# Patient Record
Sex: Male | Born: 1950
Health system: Southern US, Community
[De-identification: ages and names within clinical notes are randomized; demographics above are authoritative.]

---

## 1998-04-19 HISTORY — PX: LUMBAR DISC SURGERY: SHX700

## 2000-01-08 ENCOUNTER — Ambulatory Visit (HOSPITAL_COMMUNITY): Admission: RE | Admit: 2000-01-08 | Discharge: 2000-01-08 | Payer: Self-pay | Admitting: Neurosurgery

## 2000-01-08 ENCOUNTER — Encounter: Payer: Self-pay | Admitting: Neurosurgery

## 2000-02-16 ENCOUNTER — Ambulatory Visit (HOSPITAL_COMMUNITY): Admission: RE | Admit: 2000-02-16 | Discharge: 2000-02-17 | Payer: Self-pay | Admitting: Neurosurgery

## 2000-07-19 ENCOUNTER — Encounter: Admission: RE | Admit: 2000-07-19 | Discharge: 2000-07-19 | Payer: Self-pay | Admitting: Neurosurgery

## 2000-07-19 ENCOUNTER — Encounter: Payer: Self-pay | Admitting: Neurosurgery

## 2000-08-04 ENCOUNTER — Encounter: Payer: Self-pay | Admitting: Neurosurgery

## 2000-08-04 ENCOUNTER — Encounter: Admission: RE | Admit: 2000-08-04 | Discharge: 2000-08-04 | Payer: Self-pay | Admitting: Neurosurgery

## 2000-08-18 ENCOUNTER — Encounter: Admission: RE | Admit: 2000-08-18 | Discharge: 2000-08-18 | Payer: Self-pay | Admitting: Neurosurgery

## 2000-08-18 ENCOUNTER — Encounter: Payer: Self-pay | Admitting: Neurosurgery

## 2017-06-09 DIAGNOSIS — Z125 Encounter for screening for malignant neoplasm of prostate: Secondary | ICD-10-CM | POA: Diagnosis not present

## 2017-06-09 DIAGNOSIS — R1310 Dysphagia, unspecified: Secondary | ICD-10-CM | POA: Diagnosis not present

## 2017-06-09 DIAGNOSIS — E785 Hyperlipidemia, unspecified: Secondary | ICD-10-CM | POA: Diagnosis not present

## 2017-06-09 DIAGNOSIS — Z2820 Immunization not carried out because of patient decision for unspecified reason: Secondary | ICD-10-CM | POA: Diagnosis not present

## 2017-06-09 DIAGNOSIS — Z0000 Encounter for general adult medical examination without abnormal findings: Secondary | ICD-10-CM | POA: Diagnosis not present

## 2017-06-09 DIAGNOSIS — Z1331 Encounter for screening for depression: Secondary | ICD-10-CM | POA: Diagnosis not present

## 2017-06-09 DIAGNOSIS — Z9181 History of falling: Secondary | ICD-10-CM | POA: Diagnosis not present

## 2017-06-09 DIAGNOSIS — Z1159 Encounter for screening for other viral diseases: Secondary | ICD-10-CM | POA: Diagnosis not present

## 2017-06-09 DIAGNOSIS — Z6824 Body mass index (BMI) 24.0-24.9, adult: Secondary | ICD-10-CM | POA: Diagnosis not present

## 2018-03-29 DIAGNOSIS — L820 Inflamed seborrheic keratosis: Secondary | ICD-10-CM | POA: Diagnosis not present

## 2018-04-19 HISTORY — PX: COLONOSCOPY WITH ESOPHAGOGASTRODUODENOSCOPY (EGD): SHX5779

## 2018-06-14 DIAGNOSIS — Z0000 Encounter for general adult medical examination without abnormal findings: Secondary | ICD-10-CM | POA: Diagnosis not present

## 2018-06-14 DIAGNOSIS — Z2820 Immunization not carried out because of patient decision for unspecified reason: Secondary | ICD-10-CM | POA: Diagnosis not present

## 2018-06-14 DIAGNOSIS — Z1331 Encounter for screening for depression: Secondary | ICD-10-CM | POA: Diagnosis not present

## 2018-06-14 DIAGNOSIS — M12811 Other specific arthropathies, not elsewhere classified, right shoulder: Secondary | ICD-10-CM | POA: Diagnosis not present

## 2018-06-14 DIAGNOSIS — E785 Hyperlipidemia, unspecified: Secondary | ICD-10-CM | POA: Diagnosis not present

## 2018-06-14 DIAGNOSIS — N529 Male erectile dysfunction, unspecified: Secondary | ICD-10-CM | POA: Diagnosis not present

## 2018-06-14 DIAGNOSIS — Z125 Encounter for screening for malignant neoplasm of prostate: Secondary | ICD-10-CM | POA: Diagnosis not present

## 2018-06-14 DIAGNOSIS — Z6824 Body mass index (BMI) 24.0-24.9, adult: Secondary | ICD-10-CM | POA: Diagnosis not present

## 2018-06-14 DIAGNOSIS — Z79899 Other long term (current) drug therapy: Secondary | ICD-10-CM | POA: Diagnosis not present

## 2018-06-14 DIAGNOSIS — R1310 Dysphagia, unspecified: Secondary | ICD-10-CM | POA: Diagnosis not present

## 2018-06-14 DIAGNOSIS — R739 Hyperglycemia, unspecified: Secondary | ICD-10-CM | POA: Diagnosis not present

## 2018-06-14 DIAGNOSIS — Z9181 History of falling: Secondary | ICD-10-CM | POA: Diagnosis not present

## 2018-08-17 ENCOUNTER — Other Ambulatory Visit: Payer: Self-pay | Admitting: Gastroenterology

## 2018-08-21 ENCOUNTER — Other Ambulatory Visit: Payer: Self-pay | Admitting: Gastroenterology

## 2018-08-21 NOTE — Telephone Encounter (Signed)
LMOM to notify patient he needed to be seen by Dr. Lyndel Safe before we could send him a new Rx.

## 2018-08-21 NOTE — Telephone Encounter (Signed)
Patient wife called said her husband needs a med refill for Pantoprazole 40mg . He is a patient of his from [place]

## 2018-08-29 DIAGNOSIS — R1310 Dysphagia, unspecified: Secondary | ICD-10-CM | POA: Diagnosis not present

## 2018-08-29 DIAGNOSIS — M25521 Pain in right elbow: Secondary | ICD-10-CM | POA: Diagnosis not present

## 2018-08-29 DIAGNOSIS — Z6824 Body mass index (BMI) 24.0-24.9, adult: Secondary | ICD-10-CM | POA: Diagnosis not present

## 2018-08-29 DIAGNOSIS — M25511 Pain in right shoulder: Secondary | ICD-10-CM | POA: Diagnosis not present

## 2018-09-12 DIAGNOSIS — M6281 Muscle weakness (generalized): Secondary | ICD-10-CM | POA: Diagnosis not present

## 2018-09-12 DIAGNOSIS — M25611 Stiffness of right shoulder, not elsewhere classified: Secondary | ICD-10-CM | POA: Diagnosis not present

## 2018-09-12 DIAGNOSIS — M25511 Pain in right shoulder: Secondary | ICD-10-CM | POA: Diagnosis not present

## 2018-09-19 ENCOUNTER — Encounter: Payer: Self-pay | Admitting: Gastroenterology

## 2018-09-20 DIAGNOSIS — M6281 Muscle weakness (generalized): Secondary | ICD-10-CM | POA: Diagnosis not present

## 2018-09-20 DIAGNOSIS — M25611 Stiffness of right shoulder, not elsewhere classified: Secondary | ICD-10-CM | POA: Diagnosis not present

## 2018-09-20 DIAGNOSIS — M25511 Pain in right shoulder: Secondary | ICD-10-CM | POA: Diagnosis not present

## 2018-09-25 DIAGNOSIS — K222 Esophageal obstruction: Secondary | ICD-10-CM | POA: Diagnosis not present

## 2018-09-25 DIAGNOSIS — Z800 Family history of malignant neoplasm of digestive organs: Secondary | ICD-10-CM | POA: Diagnosis not present

## 2018-09-25 DIAGNOSIS — K219 Gastro-esophageal reflux disease without esophagitis: Secondary | ICD-10-CM | POA: Diagnosis not present

## 2018-09-25 DIAGNOSIS — R1310 Dysphagia, unspecified: Secondary | ICD-10-CM | POA: Diagnosis not present

## 2018-09-28 ENCOUNTER — Other Ambulatory Visit: Payer: Self-pay | Admitting: Internal Medicine

## 2018-09-28 DIAGNOSIS — R1310 Dysphagia, unspecified: Secondary | ICD-10-CM

## 2018-10-06 DIAGNOSIS — Z1159 Encounter for screening for other viral diseases: Secondary | ICD-10-CM | POA: Diagnosis not present

## 2018-10-12 DIAGNOSIS — Z800 Family history of malignant neoplasm of digestive organs: Secondary | ICD-10-CM | POA: Diagnosis not present

## 2018-10-12 DIAGNOSIS — K449 Diaphragmatic hernia without obstruction or gangrene: Secondary | ICD-10-CM | POA: Diagnosis not present

## 2018-10-12 DIAGNOSIS — R1310 Dysphagia, unspecified: Secondary | ICD-10-CM | POA: Diagnosis not present

## 2018-10-12 DIAGNOSIS — D125 Benign neoplasm of sigmoid colon: Secondary | ICD-10-CM | POA: Diagnosis not present

## 2018-10-12 DIAGNOSIS — K200 Eosinophilic esophagitis: Secondary | ICD-10-CM | POA: Diagnosis not present

## 2018-10-12 DIAGNOSIS — K5730 Diverticulosis of large intestine without perforation or abscess without bleeding: Secondary | ICD-10-CM | POA: Diagnosis not present

## 2018-10-12 DIAGNOSIS — K635 Polyp of colon: Secondary | ICD-10-CM | POA: Diagnosis not present

## 2018-10-12 DIAGNOSIS — K621 Rectal polyp: Secondary | ICD-10-CM | POA: Diagnosis not present

## 2018-10-12 DIAGNOSIS — K229 Disease of esophagus, unspecified: Secondary | ICD-10-CM | POA: Diagnosis not present

## 2018-10-12 DIAGNOSIS — K222 Esophageal obstruction: Secondary | ICD-10-CM | POA: Diagnosis not present

## 2018-10-12 DIAGNOSIS — K228 Other specified diseases of esophagus: Secondary | ICD-10-CM | POA: Diagnosis not present

## 2018-10-12 DIAGNOSIS — Z1211 Encounter for screening for malignant neoplasm of colon: Secondary | ICD-10-CM | POA: Diagnosis not present

## 2018-10-31 DIAGNOSIS — M25611 Stiffness of right shoulder, not elsewhere classified: Secondary | ICD-10-CM | POA: Diagnosis not present

## 2018-10-31 DIAGNOSIS — M6281 Muscle weakness (generalized): Secondary | ICD-10-CM | POA: Diagnosis not present

## 2018-10-31 DIAGNOSIS — M25511 Pain in right shoulder: Secondary | ICD-10-CM | POA: Diagnosis not present

## 2018-11-02 ENCOUNTER — Ambulatory Visit
Admission: RE | Admit: 2018-11-02 | Discharge: 2018-11-02 | Disposition: A | Discharge status: Home / Self Care | Payer: PPO | Source: Ambulatory Visit | Attending: Internal Medicine | Admitting: Internal Medicine

## 2018-11-02 DIAGNOSIS — R1310 Dysphagia, unspecified: Secondary | ICD-10-CM

## 2018-11-02 DIAGNOSIS — K449 Diaphragmatic hernia without obstruction or gangrene: Secondary | ICD-10-CM | POA: Diagnosis not present

## 2018-11-02 DIAGNOSIS — K219 Gastro-esophageal reflux disease without esophagitis: Secondary | ICD-10-CM | POA: Diagnosis not present

## 2018-11-08 ENCOUNTER — Ambulatory Visit (INDEPENDENT_AMBULATORY_CARE_PROVIDER_SITE_OTHER): Payer: PPO | Admitting: Allergy and Immunology

## 2018-11-08 ENCOUNTER — Other Ambulatory Visit: Payer: Self-pay

## 2018-11-08 ENCOUNTER — Encounter: Payer: Self-pay | Admitting: Allergy and Immunology

## 2018-11-08 VITALS — BP 118/74 | HR 64 | Temp 98.0°F | Resp 16 | Ht 69.0 in | Wt 162.0 lb

## 2018-11-08 DIAGNOSIS — Z91018 Allergy to other foods: Secondary | ICD-10-CM | POA: Diagnosis not present

## 2018-11-08 DIAGNOSIS — K200 Eosinophilic esophagitis: Secondary | ICD-10-CM | POA: Diagnosis not present

## 2018-11-08 DIAGNOSIS — R490 Dysphonia: Secondary | ICD-10-CM | POA: Diagnosis not present

## 2018-11-08 MED ORDER — BUDESONIDE 0.5 MG/2ML IN SUSP: RESPIRATORY_TRACT | 5 refills | Status: DC

## 2018-11-08 NOTE — Progress Notes (Signed)
[place] [facility] [place] - [place] - [place] - [place]   Dear Dr. Earlean Shawl,  Thank you for referring Thomas Mccoy to the [facility] on 11/08/2018.   Below is a summation of this patient's evaluation and recommendations.  Thank you for your referral. I will keep you informed about this patient's response to treatment.   If you have any questions please do not hesitate to contact me.   Sincerely,  Jiles Prows, MD Allergy / Immunology [place] [facility]   ______________________________________________________________________    NEW PATIENT NOTE  Referring Provider: Richmond Campbell, MD Primary Provider: Street, Sharon Mt, MD Date of office visit: 11/08/2018    Subjective:   Chief Complaint:  Thomas Mccoy (DOB: 03-16-1951) is a 68 y.o. male who presents to the clinic on 11/08/2018 with a chief complaint of Swallowing Issue .     HPI: Thomas Mccoy presents to this clinic in evaluation of eosinophilic esophagitis.  Since Thomas Mccoy was a late teenager he had noticed that he had problems swallowing and this has been a progressive issue to the point where he now has recurrent episodes of choking and esophageal obstruction requiring emesis for clearing of his esophagus.  He has been under the care of a GI doctor for 15 years and being treated with multiple esophageal dilations.  His last dilation performed by Dr. Earlean Shawl was 4 weeks ago and he certainly does get improvement regarding this issue though he always redevelops his choking and obstruction the further he gets away from the dilation.   With his recent dilation he was also counseled to start swallowed Flovent.  This is the first time that he has started a swallowed steroid.  Unfortunately, he is getting hoarseness from the use of this swallowed steroid.  He has been on pantoprazole for 10 years.  He does not consume any caffeine but does drink wine every  night.  He has not practiced any food avoidance measures.  He has no other associated atopic disease symptoms.  History reviewed. No pertinent past medical history.  Past Surgical History:  Procedure Laterality Date  . COLONOSCOPY WITH ESOPHAGOGASTRODUODENOSCOPY (EGD)  2020  . LUMBAR DISC SURGERY  2000    Allergies as of 11/08/2018   No Known Allergies     Medication List      CALCIUM 1200 PO Take 1 tablet by mouth daily.   fluticasone 220 MCG/ACT inhaler Commonly known as: FLOVENT HFA Two times daily, spray 2 puffs into mouth and swallow, DO NOT INHALE.  Do not use spacer.  No food or drink 30 minutes before or after sprays.   pantoprazole 40 MG tablet Commonly known as: PROTONIX Take 1 tablet by mouth daily.       Review of systems negative except as noted in HPI / PMHx or noted below:  Review of Systems  Constitutional: Negative.   HENT: Negative.   Eyes: Negative.   Respiratory: Negative.   Cardiovascular: Negative.   Gastrointestinal: Negative.   Genitourinary: Negative.   Musculoskeletal: Negative.   Skin: Negative.   Neurological: Negative.   Endo/Heme/Allergies: Negative.   Psychiatric/Behavioral: Negative.     Family History  Problem Relation Age of Onset  . Colon cancer Brother   . Bone cancer Brother   . Stroke Brother     Social History   Socioeconomic History  . Marital status: Married    Spouse name: Not on file  . Number  of children: Not on file  . Years of education: Not on file  . Highest education level: Not on file  Occupational History  . Not on file  Social Needs  . Financial resource strain: Not on file  . Food insecurity    Worry: Not on file    Inability: Not on file  . Transportation needs    Medical: Not on file    Non-medical: Not on file  Tobacco Use  . Smoking status: Never Smoker  . Smokeless tobacco: Never Used  Substance and Sexual Activity  . Alcohol use: Yes    Alcohol/week: 7.0 standard drinks    Types: 7  Glasses of wine per week  . Drug use: Never  . Sexual activity: Not on file  Lifestyle  . Physical activity    Days per week: Not on file    Minutes per session: Not on file  . Stress: Not on file  Relationships  . Social Herbalist on phone: Not on file    Gets together: Not on file    Attends religious service: Not on file    Active member of club or organization: Not on file    Attends meetings of clubs or organizations: Not on file    Relationship status: Not on file  . Intimate partner violence    Fear of current or ex partner: Not on file    Emotionally abused: Not on file    Physically abused: Not on file    Forced sexual activity: Not on file  Other Topics Concern  . Not on file  Social History Narrative  . Not on file    Environmental and Social history  Lives in a house with a dry environment, a dog located inside the household, hardwood in the bedroom, no plastic on the bed, no plastic on the pillow, no smoking located inside the household.  Objective:   Vitals:   11/08/18 0933  BP: 118/74  Pulse: 64  Resp: 16  Temp: 98 F (36.7 C)  SpO2: 97%   Height: 5\' 9"  (175.3 cm) Weight: 162 lb (73.5 kg)  Physical Exam Constitutional:      Appearance: He is not diaphoretic.  HENT:     Head: Normocephalic. No right periorbital erythema or left periorbital erythema.     Right Ear: Tympanic membrane, ear canal and external ear normal.     Left Ear: Tympanic membrane, ear canal and external ear normal.     Nose: Nose normal. No mucosal edema or rhinorrhea.     Mouth/Throat:     Pharynx: No oropharyngeal exudate.  Eyes:     General: Lids are normal.     Conjunctiva/sclera: Conjunctivae normal.     Pupils: Pupils are equal, round, and reactive to light.  Neck:     Thyroid: No thyromegaly.     Trachea: Trachea normal. No tracheal deviation.  Cardiovascular:     Rate and Rhythm: Normal rate and regular rhythm.     Heart sounds: Normal heart sounds, S1  normal and S2 normal. No murmur.  Pulmonary:     Effort: Pulmonary effort is normal. No respiratory distress.     Breath sounds: No stridor. No wheezing or rales.  Chest:     Chest wall: No tenderness.  Abdominal:     General: There is no distension.     Palpations: Abdomen is soft. There is no mass.     Tenderness: There is no abdominal tenderness. There is  no guarding or rebound.  Musculoskeletal:        General: No tenderness.  Lymphadenopathy:     Head:     Right side of head: No tonsillar adenopathy.     Left side of head: No tonsillar adenopathy.     Cervical: No cervical adenopathy.  Skin:    Coloration: Skin is not pale.     Findings: No erythema or rash.     Nails: There is no clubbing.   Neurological:     Mental Status: He is alert.     Diagnostics: Allergy skin tests were performed.  He did not demonstrate any hypersensitivity against a screening panel of foods.  Results of a esophageal biopsy performed 13 October 2018 identified the following:  B. DISTAL ESOPHAGUS, BIOPSY: Reactive squamous epithelium with increased numbers of intraepithelial eosinophils (up to 40 per hpf). Negative for intestinal metaplasia, dysplasia, and malignancy.  C. MID ESOPHAGUS, BIOPSY: Reactive squamous epithelium with increased numbers of intraepithelial eosinophils (up to 24 per hpf). Negative for intestinal metaplasia, dysplasia, and malignancy.  D. PROXIMAL ESOPHAGUS, BIOPSY: Reactive squamous epithelium with increased numbers of intraepithelial eosinophils (up to 17 per hpf). Negative for intestinal metaplasia, dysplasia, and malignancy.  Assessment and Plan:    1. Eosinophilic esophagitis   2. Food allergy   3. Hoarseness     1.  Allergen avoidance measures?  2.  Dairy avoidance diet?  3.  FFED diet?  4.  Exchange Flovent for budesonide 0.5 mg ampule mixed in 6-10 pack sucralose swallowed twice a day  5.  Does exchange of Flovent for  budesonide eliminate hoarseness?  6.  Continue budesonide for a full 12 weeks and twice a day and then if doing well attempted taper down to 1 time per day  7.  Return to clinic in 12 weeks or earlier if problem  8.  Obtain fall flu vaccine (and COVID vaccine)  Thomas Mccoy obviously has very significant symptomatic inflammation of his esophagus.  There did not appear to be a specific food allergy giving rise to this issue as assessed by skin testing.  He has the option of undergoing a dairy avoidance diet which appears to help 50% of people with eosinophilic esophagitis or a four food elimination diet which may help up to 75% of people with eosinophilic esophagitis.  He is presently considering his options regarding dietary manipulation.  Because he is developing hoarseness from Flovent we will switch him over to budesonide ampules.  I have asked him to contact me should he continue with hoarseness in the face of this medication manipulation as he will obviously require further evaluation and treatment if that is the case.  If he does choose a elimination diet as noted above then he can probably be rapidly tapered off his oral swallowed steroid and see what happens with the dietary manipulation as his sole mode of treatment for eosinophilic esophagitis.  If he does not use a elimination diet then he will probably require some dose of swallowed steroid for a prolonged period in time.  Jiles Prows, MD Allergy / Immunology [facility]

## 2018-11-08 NOTE — Patient Instructions (Signed)
  1.  Allergen avoidance measures?  2.  Dairy avoidance diet?  3.  FFED diet?  4.  Exchange Flovent for budesonide 0.5 mg ampule mixed in 6-10 pack sucralose swallowed twice a day  5.  Does exchange of Flovent for budesonide eliminate hoarseness?  6.  Continue budesonide for a full 12 weeks and twice a day and then if doing well attempted taper down to 1 time per day  7.  Return to clinic in 12 weeks or earlier if problem  8.  Obtain fall flu vaccine (and COVID vaccine)

## 2018-11-09 ENCOUNTER — Encounter: Payer: Self-pay | Admitting: Allergy and Immunology

## 2019-01-02 DIAGNOSIS — R142 Eructation: Secondary | ICD-10-CM | POA: Diagnosis not present

## 2019-01-02 DIAGNOSIS — K200 Eosinophilic esophagitis: Secondary | ICD-10-CM | POA: Diagnosis not present

## 2019-01-04 DIAGNOSIS — R972 Elevated prostate specific antigen [PSA]: Secondary | ICD-10-CM | POA: Diagnosis not present

## 2019-01-04 DIAGNOSIS — Z6822 Body mass index (BMI) 22.0-22.9, adult: Secondary | ICD-10-CM | POA: Diagnosis not present

## 2019-01-04 DIAGNOSIS — N401 Enlarged prostate with lower urinary tract symptoms: Secondary | ICD-10-CM | POA: Diagnosis not present

## 2019-01-04 DIAGNOSIS — M791 Myalgia, unspecified site: Secondary | ICD-10-CM | POA: Diagnosis not present

## 2019-01-04 DIAGNOSIS — N138 Other obstructive and reflux uropathy: Secondary | ICD-10-CM | POA: Diagnosis not present

## 2019-01-04 DIAGNOSIS — J989 Respiratory disorder, unspecified: Secondary | ICD-10-CM | POA: Diagnosis not present

## 2019-01-04 DIAGNOSIS — N390 Urinary tract infection, site not specified: Secondary | ICD-10-CM | POA: Diagnosis not present

## 2019-01-04 DIAGNOSIS — Z79899 Other long term (current) drug therapy: Secondary | ICD-10-CM | POA: Diagnosis not present

## 2019-01-05 ENCOUNTER — Inpatient Hospital Stay (HOSPITAL_COMMUNITY)
Admission: EM | Admit: 2019-01-05 | Discharge: 2019-01-07 | DRG: 443 | Disposition: A | Discharge status: Home / Self Care | Payer: PPO | Attending: Internal Medicine | Admitting: Internal Medicine

## 2019-01-05 ENCOUNTER — Emergency Department (HOSPITAL_COMMUNITY): Payer: PPO

## 2019-01-05 ENCOUNTER — Encounter (HOSPITAL_COMMUNITY): Payer: Self-pay | Admitting: Emergency Medicine

## 2019-01-05 ENCOUNTER — Other Ambulatory Visit: Payer: Self-pay

## 2019-01-05 ENCOUNTER — Inpatient Hospital Stay (HOSPITAL_COMMUNITY): Payer: PPO

## 2019-01-05 DIAGNOSIS — Z800 Family history of malignant neoplasm of digestive organs: Secondary | ICD-10-CM

## 2019-01-05 DIAGNOSIS — K200 Eosinophilic esophagitis: Secondary | ICD-10-CM | POA: Diagnosis not present

## 2019-01-05 DIAGNOSIS — B179 Acute viral hepatitis, unspecified: Secondary | ICD-10-CM | POA: Diagnosis not present

## 2019-01-05 DIAGNOSIS — Z20828 Contact with and (suspected) exposure to other viral communicable diseases: Secondary | ICD-10-CM | POA: Diagnosis not present

## 2019-01-05 DIAGNOSIS — B159 Hepatitis A without hepatic coma: Secondary | ICD-10-CM | POA: Diagnosis not present

## 2019-01-05 DIAGNOSIS — F1010 Alcohol abuse, uncomplicated: Secondary | ICD-10-CM | POA: Diagnosis present

## 2019-01-05 DIAGNOSIS — Z79899 Other long term (current) drug therapy: Secondary | ICD-10-CM

## 2019-01-05 DIAGNOSIS — Z66 Do not resuscitate: Secondary | ICD-10-CM

## 2019-01-05 DIAGNOSIS — K7689 Other specified diseases of liver: Secondary | ICD-10-CM | POA: Diagnosis not present

## 2019-01-05 DIAGNOSIS — Z7951 Long term (current) use of inhaled steroids: Secondary | ICD-10-CM

## 2019-01-05 DIAGNOSIS — K820 Obstruction of gallbladder: Secondary | ICD-10-CM | POA: Diagnosis not present

## 2019-01-05 DIAGNOSIS — N200 Calculus of kidney: Secondary | ICD-10-CM | POA: Diagnosis not present

## 2019-01-05 DIAGNOSIS — K759 Inflammatory liver disease, unspecified: Secondary | ICD-10-CM | POA: Insufficient documentation

## 2019-01-05 DIAGNOSIS — R7989 Other specified abnormal findings of blood chemistry: Secondary | ICD-10-CM | POA: Diagnosis not present

## 2019-01-05 LAB — COMPREHENSIVE METABOLIC PANEL
ALT: 3623 U/L — ABNORMAL HIGH (ref 0–44)
AST: 3527 U/L — ABNORMAL HIGH (ref 15–41)
Albumin: 2.7 g/dL — ABNORMAL LOW (ref 3.5–5.0)
Alkaline Phosphatase: 196 U/L — ABNORMAL HIGH (ref 38–126)
Anion gap: 8 (ref 5–15)
BUN: 10 mg/dL (ref 8–23)
CO2: 27 mmol/L (ref 22–32)
Calcium: 8.1 mg/dL — ABNORMAL LOW (ref 8.9–10.3)
Chloride: 101 mmol/L (ref 98–111)
Creatinine, Ser: 0.83 mg/dL (ref 0.61–1.24)
GFR calc Af Amer: >60 mL/min (ref >60)
GFR calc non Af Amer: >60 mL/min (ref >60)
Glucose, Bld: 101 mg/dL — ABNORMAL HIGH (ref 70–99)
Potassium: 4.2 mmol/L (ref 3.5–5.1)
Sodium: 136 mmol/L (ref 135–145)
Total Bilirubin: 10.4 mg/dL — ABNORMAL HIGH (ref 0.3–1.2)
Total Protein: 6 g/dL — ABNORMAL LOW (ref 6.5–8.1)

## 2019-01-05 LAB — HEPATIC FUNCTION PANEL
ALT: 3100 U/L — ABNORMAL HIGH (ref 0–44)
AST: 2780 U/L — ABNORMAL HIGH (ref 15–41)
Albumin: 2.4 g/dL — ABNORMAL LOW (ref 3.5–5.0)
Alkaline Phosphatase: 172 U/L — ABNORMAL HIGH (ref 38–126)
Bilirubin, Direct: 5.4 mg/dL — ABNORMAL HIGH (ref 0.0–0.2)
Indirect Bilirubin: 3.9 mg/dL — ABNORMAL HIGH (ref 0.3–0.9)
Total Bilirubin: 9.3 mg/dL — ABNORMAL HIGH (ref 0.3–1.2)
Total Protein: 5.4 g/dL — ABNORMAL LOW (ref 6.5–8.1)

## 2019-01-05 LAB — CBC WITH DIFFERENTIAL/PLATELET
Abs Immature Granulocytes: 0.01 10*3/uL (ref 0.00–0.07)
Basophils Absolute: 0 10*3/uL (ref 0.0–0.1)
Basophils Relative: 1 %
Eosinophils Absolute: 0 10*3/uL (ref 0.0–0.5)
Eosinophils Relative: 1 %
HCT: 48.4 % (ref 39.0–52.0)
Hemoglobin: 16.3 g/dL (ref 13.0–17.0)
Immature Granulocytes: 0 %
Lymphocytes Relative: 36 %
Lymphs Abs: 1.5 10*3/uL (ref 0.7–4.0)
MCH: 32.7 pg (ref 26.0–34.0)
MCHC: 33.7 g/dL (ref 30.0–36.0)
MCV: 97 fL (ref 80.0–100.0)
Monocytes Absolute: 0.8 10*3/uL (ref 0.1–1.0)
Monocytes Relative: 18 %
Neutro Abs: 1.9 10*3/uL (ref 1.7–7.7)
Neutrophils Relative %: 44 %
Platelets: 195 10*3/uL (ref 150–400)
RBC: 4.99 MIL/uL (ref 4.22–5.81)
RDW: 13.6 % (ref 11.5–15.5)
WBC: 4.2 10*3/uL (ref 4.0–10.5)
nRBC: 0 % (ref 0.0–0.2)

## 2019-01-05 LAB — URINALYSIS, ROUTINE W REFLEX MICROSCOPIC
Bacteria, UA: NONE SEEN
Glucose, UA: NEGATIVE mg/dL
Ketones, ur: 5 mg/dL — AB
Leukocytes,Ua: NEGATIVE
Nitrite: NEGATIVE
Protein, ur: NEGATIVE mg/dL
Specific Gravity, Urine: 1.016 (ref 1.005–1.030)
pH: 6 (ref 5.0–8.0)

## 2019-01-05 LAB — APTT: aPTT: 33 seconds (ref 24–36)

## 2019-01-05 LAB — GAMMA GT: GGT: 116 U/L — ABNORMAL HIGH (ref 7–50)

## 2019-01-05 LAB — PROTIME-INR
INR: 1.2 (ref 0.8–1.2)
Prothrombin Time: 15.2 seconds (ref 11.4–15.2)

## 2019-01-05 LAB — ACETAMINOPHEN LEVEL: Acetaminophen (Tylenol), Serum: <10 ug/mL — ABNORMAL LOW (ref 10–30)

## 2019-01-05 LAB — LACTATE DEHYDROGENASE: LDH: 508 U/L — ABNORMAL HIGH (ref 98–192)

## 2019-01-05 LAB — LIPASE, BLOOD: Lipase: 57 U/L — ABNORMAL HIGH (ref 11–51)

## 2019-01-05 LAB — SARS CORONAVIRUS 2 (TAT 6-24 HRS): SARS Coronavirus 2: NEGATIVE

## 2019-01-05 MED ORDER — ONDANSETRON HCL 4 MG/2ML IJ SOLN
4.0000 mg | Freq: Once | INTRAMUSCULAR | Status: AC
  Administered 2019-01-05: 15:00:00 4 mg via INTRAVENOUS
  Filled 2019-01-05: qty 2

## 2019-01-05 MED ORDER — IOHEXOL 300 MG/ML  SOLN
100.0000 mL | Freq: Once | INTRAMUSCULAR | Status: AC | PRN
  Administered 2019-01-05: 100 mL via INTRAVENOUS

## 2019-01-05 MED ORDER — SODIUM CHLORIDE 0.9 % IV BOLUS
1000.0000 mL | Freq: Once | INTRAVENOUS | Status: AC
  Administered 2019-01-05: 15:00:00 1000 mL via INTRAVENOUS

## 2019-01-05 NOTE — ED Triage Notes (Signed)
Pt reports being sent by PCP due to high liver levels on blood work that was taken yesterday. Pt reports frequent urination, low energy, no appetite for few weeks.

## 2019-01-05 NOTE — ED Provider Notes (Signed)
[facility] EMERGENCY DEPARTMENT Provider Note   CSN: [id number] Arrival date & time: 01/05/19  1122     History   Chief Complaint Chief Complaint  Patient presents with  . Abnormal Lab    HPI Thomas Mccoy is a 68 y.o. male.     The history is provided by the patient and medical records. No language interpreter was used.     68 year old male sent here by PCP for evaluation of elevated liver enzyme from blood work that was obtained yesterday.  Patient report for the past 2 weeks he has not been feeling well.  States he is getting progressively weak.  He has intermittent diffuse abdominal discomfort as well as increased urination.  He also noticed that his urine has turned to a more orange in color.  He also noticed that his stools are lighter in color which is new.  Endorsed intermittent low-grade fever, as well as 5 pounds of unintentional weight loss for the same duration.  Endorse occasional night sweats.  Endorsed nausea and loss of appetite.  He took Aleve on occasion without any relief.  Did report having a cold for weeks ago with some lingering cough.  He denies no other sick contact.  He denies any Tylenol use.  He does admits to history of alcohol use and drinks approximately 3 glasses of wine nightly.  He has not noticed any blood in his stools.  He denies having chest pain or shortness of breath or leg swelling.  He does notice that his skin is more yellow than normal which is new.  He denies any prior history of cancer.  His abdominal pain is mild at this time.    History reviewed. No pertinent past medical history.  There are no active problems to display for this patient.   Past Surgical History:  Procedure Laterality Date  . COLONOSCOPY WITH ESOPHAGOGASTRODUODENOSCOPY (EGD)  2020  . LUMBAR DISC SURGERY  2000        Home Medications    Prior to Admission medications   Medication Sig Start Date End Date Taking? Authorizing Provider   budesonide (PULMICORT) 0.5 MG/2ML nebulizer solution 1 ampule mixed with 6-10 packs sucralose and swallowed 2x daily. 11/08/18   Kozlow, Donnamarie Poag, MD  Calcium Carbonate-Vit D-Min (CALCIUM 1200 PO) Take 1 tablet by mouth daily.    [provider]  fluticasone (FLOVENT HFA) 220 MCG/ACT inhaler Two times daily, spray 2 puffs into mouth and swallow, DO NOT INHALE.  Do not use spacer.  No food or drink 30 minutes before or after sprays. 10/19/18   [provider]  pantoprazole (PROTONIX) 40 MG tablet Take 1 tablet by mouth daily. 08/29/18   [provider]    Family History Family History  Problem Relation Age of Onset  . Colon cancer Brother   . Bone cancer Brother   . Stroke Brother     Social History Social History   Tobacco Use  . Smoking status: Never Smoker  . Smokeless tobacco: Never Used  Substance Use Topics  . Alcohol use: Yes    Alcohol/week: 7.0 standard drinks    Types: 7 Glasses of wine per week  . Drug use: Never     Allergies   Patient has no known allergies.   Review of Systems Review of Systems  All other systems reviewed and are negative.    Physical Exam Updated Vital Signs BP 117/67 (BP Location: Right Arm)   Pulse 80  Temp 97.7 F (36.5 C) (Oral)   Resp 18   Ht '5\' 9"'$  (1.753 m)   Wt 72.1 kg   SpO2 100%   BMI 23.48 kg/m   Physical Exam Vitals signs and nursing note reviewed.  Constitutional:      General: He is not in acute distress.    Appearance: He is well-developed.  HENT:     Head: Atraumatic.     Mouth/Throat:     Mouth: Mucous membranes are moist.  Eyes:     General: Scleral icterus present.     Conjunctiva/sclera: Conjunctivae normal.  Neck:     Musculoskeletal: Normal range of motion and neck supple.  Cardiovascular:     Rate and Rhythm: Normal rate and regular rhythm.     Pulses: Normal pulses.     Heart sounds: Normal heart sounds.  Pulmonary:     Effort: Pulmonary effort is normal.     Breath  sounds: Normal breath sounds. No wheezing, rhonchi or rales.  Abdominal:     General: Abdomen is flat.     Palpations: Abdomen is soft.     Tenderness: There is abdominal tenderness (Mild diffuse abdominal tenderness without guarding or rebound tenderness).  Skin:    Coloration: Skin is jaundiced.     Findings: No rash.  Neurological:     Mental Status: He is alert and oriented to person, place, and time.  Psychiatric:        Mood and Affect: Mood normal.      ED Treatments / Results  Labs (all labs ordered are listed, but only abnormal results are displayed) Labs Reviewed  COMPREHENSIVE METABOLIC PANEL - Abnormal; Notable for the following components:      Result Value   Glucose, Bld 101 (*)    Calcium 8.1 (*)    Total Protein 6.0 (*)    Albumin 2.7 (*)    AST 3,527 (*)    ALT 3,623 (*)    Alkaline Phosphatase 196 (*)    Total Bilirubin 10.4 (*)    All other components within normal limits  LIPASE, BLOOD - Abnormal; Notable for the following components:   Lipase 57 (*)    All other components within normal limits  URINALYSIS, ROUTINE W REFLEX MICROSCOPIC - Abnormal; Notable for the following components:   Color, Urine AMBER (*)    Hgb urine dipstick SMALL (*)    Bilirubin Urine MODERATE (*)    Ketones, ur 5 (*)    All other components within normal limits  SARS CORONAVIRUS 2 (TAT 6-24 HRS)  CBC WITH DIFFERENTIAL/PLATELET  PATHOLOGIST SMEAR REVIEW  ACETAMINOPHEN LEVEL  HEPATITIS PANEL, ACUTE  PROTIME-INR    EKG None  Radiology No results found.  Procedures Procedures (including critical care time)  Medications Ordered in ED Medications  sodium chloride 0.9 % bolus 1,000 mL (has no administration in time range)  ondansetron (ZOFRAN) injection 4 mg (has no administration in time range)     Initial Impression / Assessment and Plan / ED Course  I have reviewed the triage vital signs and the nursing notes.  Pertinent labs & imaging results that were  available during my care of the patient were reviewed by me and considered in my medical decision making (see chart for details).        BP 117/67 (BP Location: Right Arm)   Pulse 80   Temp 97.7 F (36.5 C) (Oral)   Resp 18   Ht '5\' 9"'$  (1.753 m)   Wt 72.1  kg   SpO2 100%   BMI 23.48 kg/m    Final Clinical Impressions(s) / ED Diagnoses   Final diagnoses:  None    ED Discharge Orders    None     3:09 PM Elderly male with history of alcohol use presenting with abnormal liver enzyme his doctor's office yesterday.  He Appears jaundiced along with some mild abdominal discomfort.  Labs remarkable for transaminitis with AST 3527, ALT 3623, alk phos 196, total bili 10.4 mildly elevated lipase of 57.  UA shows moderate amount of urobilinogen.  In the setting of alcohol abuse, and unintentional weight loss, night sweats, and fever, I am concerned for potential malignancy causing his jaundice.  Will obtain hepatitis panel, Tylenol level, coagulation panel as well as abdominal pelvis CT scan for further ration.  Will give IV fluid and antinausea medication.  Will order COVID-19 test.  Anticipate admission.  Care discussed with Dr. Sherry Ruffing who will continue with care.    Domenic Moras, PA-C 01/05/19 [place], MD 01/05/19 [phone]

## 2019-01-05 NOTE — H&P (Signed)
Date: 01/05/2019               Patient Name:  Thomas Mccoy MRN: [id number]  DOB: 1951/04/03 Age / Sex: 68 y.o., male   PCP: Street, Sharon Mt, MD         Medical Service: Internal Medicine Teaching Service         Attending Physician: Dr. Aldine Contes, MD    First Contact: Dr. Jeanmarie Hubert, MD Pager: [phone]  Second Contact: Dr. Kathi Ludwig, MD Pager: [phone]       After Hours (After 5p/  First Contact Pager: [phone]  weekends / holidays): Second Contact Pager: [phone]   Chief Complaint: Fatigue, dark urine  History of Present Illness: Mr. Goon is a 68 year old male with past medical history of eosinophilic esophagitis who presents with a 1 to 2-week history of dark urine, fatigue, decreased appetite, and subjective fevers.  Patient also reports nausea (no emesis) and approximately 5 pound weight loss over the past couple of weeks. He states he drinks 3 glasses of wine nightly.  Patient reports the only medications he takes are pantoprazole, doxycycline (started recently by PCP concern for pneumonia), and steroids (steroid started approximately 6 weeks ago) for eosinophilic esophagitis.  Only over-the-counter medications are fish oil and calcium.  Patient denies diarrhea, changes in stool color/caliber, recent travel, recreational drug use including IV drugs, history of blood transfusions, changes in food consumption, sick contacts, heart disease, diabetes, skin rashes, arthritis, history of heart attacks/chest pain, history of acute onset abdominal pain.  Meds:  * Pantoprazole * Prednisone * Doxycycline * Fish oil * Calcium supplement  Allergies: Denies allergies to any medications  Family History:  * Brother died from bone cancer * Brother with Alzheimer's, deceased * Brother colon cancer, alive  Social History:  * 3 glasses white wine/evening * Never smoked tobacco * Denies all recreational drug use, including IV drugs * Lives with wife *  Retired, used to work in Art therapist for [employer]: A complete ROS was negative except as per HPI.  Physical Exam: Blood pressure (!) 146/98, pulse 78, temperature 97.7 F (36.5 C), temperature source Oral, resp. rate 18, height 5\' 9"  (1.753 m), weight 72.1 kg, SpO2 99 %. Physical Exam  Constitutional: He is well-developed, well-nourished, and in no distress.  HENT:  Head: Normocephalic and atraumatic.  Jaundiced  Eyes: EOM are normal. Right eye exhibits no discharge. Left eye exhibits no discharge.  Neck: Normal range of motion. No tracheal deviation present.  Cardiovascular: Normal rate and regular rhythm. Exam reveals no gallop and no friction rub.  No murmur heard. Pulmonary/Chest: Effort normal and breath sounds normal. No respiratory distress. He has no wheezes. He has no rales.  Abdominal: Soft. He exhibits no distension. There is no abdominal tenderness. There is no rebound and no guarding.  Musculoskeletal: Normal range of motion.        General: No tenderness, deformity or edema.  Neurological: He is alert. Coordination normal.  Skin: Skin is warm and dry. No rash noted. He is not diaphoretic. No erythema.  Jaundiced  Psychiatric: Memory and judgment normal.    EKG: personally reviewed my interpretation is not performed  CXR: personally reviewed my interpretation is not performed  CT: No suspicion hepatic lesions on CT, no intrahepatic retroperitoneal dilation, contracted gallbladder with gallbladder wall thickening/edema which may be secondary to acute hepatic inflammation/hepatitis.  Assessment & Plan by Problem: Active Problems:   Hepatitis, acute  Patient is a 68 year old male with past medical history of eosinophilic esophagitis who presents with a two-week history of fatigue, dark urine, and nausea.  # Acute hepatitis: Patient with nonspecific symptoms and presents after PCP evaluation found elevated LFTs yesterday.  Patient  with hepatocellular pattern of liver dysfunction - AST/ALT of 3527/3626, alkaline phosphatase of 196, total bilirubin of 10.4. Differential remains broad to include acute viral vs. ischemic vs. toxin mediated vs. autoimmune.  No sick contacts, new food intake, or GI symptoms to suggest acute viral hepatitis.  No evidence on CT for obstruction that could have precipitated ischemic injury, patient hemodynamically stable.  Acetaminophen negative and no apparent toxin injury. Patient has history of EOE which may suggest predisposition to autoimmune conditions. * PTT/INR to assess liver function, albumin decreased to 2.7 * US Liver Doppler to assess for obstruction * LDH, GGT, hepatitis panel, will check ALT/LDH ratio to help distinguish between viral and toxic causes * Autoimmune workup for PSC/PBC with ANA, Anti-smooth muscle antibody, Anti-microsomal antibody * Ceruloplasmin to assess for Wilson's disease  # Eosinophilic Esophagitis: Will hold PPI, steroids for now   DVT Ppx: SCDs Code status: Patient is DNR/DNI Dispo: Admit patient to Inpatient with expected length of stay greater than 2 midnights.  Signed: Jeanmarie Hubert, MD 01/05/2019, 6:03 PM  Pager: [phone]

## 2019-01-05 NOTE — ED Provider Notes (Signed)
Patient care assumed from PA [place]way at approximately 3:30 PM, patient presented with 2 weeks of fatigue and some generalized abdominal discomfort.  Patient found to have elevated LFTs and bilirubin.  Plan: Follow-up on CT of the abdomen and laboratory studies  CT of the abdomen pelvis showed contracted gallbladder with some gallbladder wall thickening/edema in the setting of abnormal LFTs this appearance may be secondary to acute hepatitis/inflammation.  Patient updated on CT findings, patient afebrile with stable vital signs on reevaluation.  Patient was admitted to the internal medicine service for further evaluation and management of his transaminitis and elevated bilirubin.  Patient seen and plan discussed with Dr. Gaylord Shih, Missy Sabins, MD 01/06/19 [id number]    Tegeler, Gwenyth Allegra, MD 01/06/19 1048

## 2019-01-06 DIAGNOSIS — B159 Hepatitis A without hepatic coma: Secondary | ICD-10-CM

## 2019-01-06 LAB — PROTIME-INR
INR: 1.2 (ref 0.8–1.2)
Prothrombin Time: 15.3 seconds — ABNORMAL HIGH (ref 11.4–15.2)

## 2019-01-06 LAB — HEPATITIS PANEL, ACUTE
HCV Ab: <0.1 s/co ratio (ref 0.0–0.9)
Hep A IgM: POSITIVE — AB
Hep B C IgM: NEGATIVE
Hepatitis B Surface Ag: NEGATIVE

## 2019-01-06 LAB — BASIC METABOLIC PANEL
Anion gap: 9 (ref 5–15)
BUN: 10 mg/dL (ref 8–23)
CO2: 24 mmol/L (ref 22–32)
Calcium: 7.7 mg/dL — ABNORMAL LOW (ref 8.9–10.3)
Chloride: 103 mmol/L (ref 98–111)
Creatinine, Ser: 0.82 mg/dL (ref 0.61–1.24)
GFR calc Af Amer: >60 mL/min (ref >60)
GFR calc non Af Amer: >60 mL/min (ref >60)
Glucose, Bld: 75 mg/dL (ref 70–99)
Potassium: 3.7 mmol/L (ref 3.5–5.1)
Sodium: 136 mmol/L (ref 135–145)

## 2019-01-06 LAB — HEPATIC FUNCTION PANEL
ALT: 3120 U/L — ABNORMAL HIGH (ref 0–44)
AST: 2615 U/L — ABNORMAL HIGH (ref 15–41)
Albumin: 2.4 g/dL — ABNORMAL LOW (ref 3.5–5.0)
Alkaline Phosphatase: 175 U/L — ABNORMAL HIGH (ref 38–126)
Bilirubin, Direct: 6.4 mg/dL — ABNORMAL HIGH (ref 0.0–0.2)
Indirect Bilirubin: 4.1 mg/dL — ABNORMAL HIGH (ref 0.3–0.9)
Total Bilirubin: 10.5 mg/dL — ABNORMAL HIGH (ref 0.3–1.2)
Total Protein: 5.4 g/dL — ABNORMAL LOW (ref 6.5–8.1)

## 2019-01-06 LAB — HIV ANTIBODY (ROUTINE TESTING W REFLEX): HIV Screen 4th Generation wRfx: NONREACTIVE

## 2019-01-06 LAB — APTT: aPTT: 35 seconds (ref 24–36)

## 2019-01-06 MED ORDER — PANTOPRAZOLE SODIUM 40 MG PO TBEC
40.0000 mg | DELAYED_RELEASE_TABLET | Freq: Every day | ORAL | Status: DC
  Administered 2019-01-06 – 2019-01-07 (×2): 40 mg via ORAL
  Filled 2019-01-06 (×2): qty 1

## 2019-01-06 MED ORDER — SODIUM CHLORIDE 0.9 % IV SOLN
INTRAVENOUS | Status: DC
  Administered 2019-01-06 (×2): via INTRAVENOUS

## 2019-01-06 MED ORDER — ENOXAPARIN SODIUM 40 MG/0.4ML ~~LOC~~ SOLN
40.0000 mg | SUBCUTANEOUS | Status: DC
  Administered 2019-01-06 – 2019-01-07 (×2): 40 mg via SUBCUTANEOUS
  Filled 2019-01-06 (×3): qty 0.4

## 2019-01-06 NOTE — ED Notes (Signed)
Admitting MD's x 2 called.

## 2019-01-06 NOTE — ED Notes (Signed)
Pt arrived to Rm 53 via stretcher. Pt noted to be alert, oriented, calm, cooperative. Pt voiced understanding of tx plan - waiting on bed assignment.

## 2019-01-06 NOTE — Progress Notes (Signed)
Report given to [facility] nurse. All belonging sent with patient. He will be transferred to room 6N23.Pt's spouse notify.  Ave Filter, RN

## 2019-01-06 NOTE — Progress Notes (Signed)
  Subjective:  Patient reports that he feels better, feels a little nauseous.  Patient says he is hungry and his appetite is improved.  Patient formed on diagnosis of hepatitis a.  Informed on plan of care, all questions answered.   Objective:    Vital Signs (last 24 hours): Vitals:   01/06/19 0700 01/06/19 0730 01/06/19 0800 01/06/19 0900  BP: (!) 108/59 104/60 104/60   Pulse: 71 71 78 83  Resp: 14 12 19  (!) 21  Temp:      TempSrc:      SpO2: 99% 98% 100% 100%  Weight:      Height:        Physical Exam: General Alert and answers questions appropriately, no acute distress  Cardiac Regular rate and rhythm, no murmurs, rubs, or gallops  Pulmonary Clear to auscultation bilaterally without wheezes, rhonchi, or rales  Abdominal Soft, mild tenderness across lower abdomen, without distention. Bowel sounds present  Extremities No peripheral edema    Assessment/Plan:   Active Problems:   Hepatitis, acute   Hepatitis   Hepatitis A  Patient is a 68 year old male who presented with 2-week history of fatigue, dark urine, and nausea and was found to have hepatitis A.  # Hepatitis A: Hepatitis panel positive for hepatitis A.  Patient symptoms are stable/improving.  AST/ALT of 3527/3623 on presentation, LFTs downtrended this morning to 2615/3120.  Patient maintains reassuring synthetic function with INR 1.2, prothrombin time 15.3, PTT 35.  Albumin was low at 2.4.  Total bilirubin of 10.4 yesterday and 10.5 today.  LDH was 508, ALT/LDH ratio is consistent with viral cause. * Advance to clear liquid diet this AM * NS bolus given in ER, on NS maintenance fluids at 75 ml/hr * Recheck hepatic function panel tomorrow AM * Will followup additional labs, unlikely to be significant given hepatitis A positive - ANA, Anti-smooth muscle, antimicrosomal, ceruloplasmin, CMV, IgG   Diet: Clear liquid DVT Ppx: Lovenox 40 mg QD Dispo: Anticipated discharge in approximately 1-2 days  Jeanmarie Hubert,  MD 01/06/2019, 9:55 AM Pager: [phone]

## 2019-01-06 NOTE — ED Notes (Signed)
Pt and spouse aware pt to be transported to 5C13 via stretcher.

## 2019-01-06 NOTE — ED Notes (Signed)
Lunch ordered 

## 2019-01-06 NOTE — ED Notes (Signed)
Pt lying on stretcher texting. States he feels fine right now.

## 2019-01-06 NOTE — ED Notes (Signed)
Male visitor w/pt.

## 2019-01-06 NOTE — ED Notes (Signed)
Secretary paging admitting MD so RN may ask for CIWA and diet.

## 2019-01-07 ENCOUNTER — Encounter (HOSPITAL_COMMUNITY): Payer: Self-pay | Admitting: *Deleted

## 2019-01-07 LAB — BASIC METABOLIC PANEL
Anion gap: 7 (ref 5–15)
BUN: 5 mg/dL — ABNORMAL LOW (ref 8–23)
CO2: 24 mmol/L (ref 22–32)
Calcium: 7.4 mg/dL — ABNORMAL LOW (ref 8.9–10.3)
Chloride: 105 mmol/L (ref 98–111)
Creatinine, Ser: 0.84 mg/dL (ref 0.61–1.24)
GFR calc Af Amer: >60 mL/min (ref >60)
GFR calc non Af Amer: >60 mL/min (ref >60)
Glucose, Bld: 84 mg/dL (ref 70–99)
Potassium: 3.3 mmol/L — ABNORMAL LOW (ref 3.5–5.1)
Sodium: 136 mmol/L (ref 135–145)

## 2019-01-07 LAB — CERULOPLASMIN: Ceruloplasmin: 21.6 mg/dL (ref 16.0–31.0)

## 2019-01-07 LAB — HEPATIC FUNCTION PANEL
ALT: 2202 U/L — ABNORMAL HIGH (ref 0–44)
AST: 1497 U/L — ABNORMAL HIGH (ref 15–41)
Albumin: 2.2 g/dL — ABNORMAL LOW (ref 3.5–5.0)
Alkaline Phosphatase: 150 U/L — ABNORMAL HIGH (ref 38–126)
Bilirubin, Direct: 6.6 mg/dL — ABNORMAL HIGH (ref 0.0–0.2)
Indirect Bilirubin: 4.4 mg/dL — ABNORMAL HIGH (ref 0.3–0.9)
Total Bilirubin: 11 mg/dL — ABNORMAL HIGH (ref 0.3–1.2)
Total Protein: 5.1 g/dL — ABNORMAL LOW (ref 6.5–8.1)

## 2019-01-07 LAB — IGG: IgG (Immunoglobin G), Serum: 981 mg/dL (ref 603–1613)

## 2019-01-07 NOTE — Progress Notes (Signed)
   Subjective: The patient denied abdominal pain today. He stated that overall he feels well and is agreeable to discharge home. I have answered all questions today including the need for vaccination with regard to those that live in his household.   Objective:  Vital signs in last 24 hours: Vitals:   01/06/19 1439 01/06/19 1832 01/06/19 2103 01/07/19 0417  BP: 118/71 129/80 139/85 129/76  Pulse: 81 75 86 73  Resp: 16 18 18 16   Temp: 98.7 F (37.1 C) 98.2 F (36.8 C) 98.7 F (37.1 C) 98.4 F (36.9 C)  TempSrc: Oral Oral Oral Oral  SpO2: 98% 100% 100% 99%  Weight:  73 kg    Height:  5\' 9"  (1.753 m)     General: A/O x4, in no acute distress, afebrile, nondiaphoretic HEENT: PEERL, EMO intact Cardio: RRR, no mrg's  Pulmonary: CTA bilaterally, no wheezing or crackles  Abdomen: Bowel sounds normal, soft, nontender  MSK: BLE nontender, nonedematous Neuro: Alert, CNII-XII grossly intact, conversational, strength 5/5 in the upper and lower extremities bilaterally, normal gait Psych: Appropriate affect, not depressed in appearance, engages well  Assessment/Plan:  Active Problems:   Hepatitis, acute   Hepatitis   Hepatitis A  The patient is a 68 year old male with incidental past medical history initially presented with 2 weeks of fatigue, dark urine, nausea and anorexia.  Extensive evaluation was undertaken given the marked elevation of transaminases greater than 3000 which revealed acute hepatitis A.  His labs and ultrasound were otherwise unremarkable for ischemic or occlusive disease. Given his symptomatic improvement as well as the improvement in his transaminases he appears clinically stable for discharge home with close outpatient follow-up.   A/P: Hepatitis A: Lab test confirming this.  Initial AST/ALT 3527/3623 now decreased to 1497/2202 this a.m.  Bili stable at 11 but this will be expected for some time. - Patient is able to tolerate p.o. intake sufficiently he is cleared  for discharge today - He may to follow-up with his PCP for repeat hepatic panel approxi-1 week - Serum IgG, cervical plasmin, CMV DNA, and autoimmune panel pending.  Dispo: Anticipated discharge in approximately 0 day(s).   Kathi Ludwig, MD 01/07/2019, 9:49 AM Pager: # [phone]

## 2019-01-07 NOTE — Discharge Summary (Addendum)
Name: Thomas Mccoy MRN: [id number] DOB: [birth year] 68 y.o. PCP: Street, Sharon [place], MD  Date of Admission: 01/05/2019 11:44 AM Date of Discharge: 9/20/202009/20/2020 Attending Physician: Dr. Dareen Piano  Discharge Diagnosis: 1. Acute hepatitis A  Discharge Medications: Allergies as of 01/07/2019   No Known Allergies      Medication List     STOP taking these medications    budesonide 0.5 MG/2ML nebulizer solution Commonly known as: Pulmicort   doxycycline 100 MG capsule Commonly known as: VIBRAMYCIN   fluconazole 150 MG tablet Commonly known as: DIFLUCAN       TAKE these medications    CALCIUM 1200 PO Take 1 tablet by mouth daily.   Fish Oil 1200 MG Caps Take 1,200 mg by mouth daily.   pantoprazole 40 MG tablet Commonly known as: PROTONIX Take 1 tablet by mouth daily.   tamsulosin 0.4 MG Caps capsule Commonly known as: FLOMAX Take 0.4 mg by mouth at bedtime.        Disposition and follow-up:   Mr.Thomas Mccoy was discharged from [facility] in Stable condition.  At the hospital follow up visit please address:  1.  Hepatitis A: Presented with nausea, jaundice, and fatigue. Hep A positive on screening panel, [place] and CT negative for infarction vs obstruction. Will need repeat labs to confirm resolution and EtOH use cessation. Patient advised of such.  2.  Labs / imaging needed at time of follow-up: CMP  3.  Pending labs/ test needing follow-up: Serum IgG, ceruloplasmin, CMV DNA, and autoimmune panel pending.  Follow-up Appointments:    Hospital Course by problem list: 1. The patient is a 68 year old male with incidental past medical history initially presented with 2 weeks of fatigue, dark urine, nausea and anorexia.  Extensive evaluation was undertaken given the marked elevation of transaminases greater than 3000 which revealed acute hepatitis A.  His labs and ultrasound were otherwise unremarkable for ischemic or occlusive disease. He  will need repeat hepatic testing to confirm continued resolution of his symptoms.   Discharge Vitals:   BP 129/76 (BP Location: Left Arm)   Pulse 73   Temp 98.4 F (36.9 C) (Oral)   Resp 16   Ht 5\' 9"  (1.753 m)   Wt 73 kg   SpO2 99%   BMI 23.78 kg/m   Pertinent Labs, Studies, and Procedures:  CMP Latest Ref Rng & Units 01/07/2019 01/06/2019 01/05/2019  Glucose 70 - 99 mg/dL 84 75 -  BUN 8 - 23 mg/dL 5(L) 10 -  Creatinine 0.61 - 1.24 mg/dL 0.84 0.82 -  Sodium 135 - 145 mmol/L 136 136 -  Potassium 3.5 - 5.1 mmol/L 3.3(L) 3.7 -  Chloride 98 - 111 mmol/L 105 103 -  CO2 22 - 32 mmol/L 24 24 -  Calcium 8.9 - 10.3 mg/dL 7.4(L) 7.7(L) -  Total Protein 6.5 - 8.1 g/dL 5.1(L) 5.4(L) 5.4(L)  Total Bilirubin 0.3 - 1.2 mg/dL 11.0(H) 10.5(H) 9.3(H)  Alkaline Phos 38 - 126 U/L 150(H) 175(H) 172(H)  AST 15 - 41 U/L 1,497(H) 2,615(H) 2,780(H)  ALT 0 - 44 U/L 2,202(H) 3,120(H) 3,100(H)   Discharge Instructions: Discharge Instructions     Call MD for:  difficulty breathing, headache or visual disturbances   Complete by: As directed    Call MD for:  hives   Complete by: As directed    Call MD for:  persistant dizziness or light-headedness   Complete by: As directed    Call MD for:  persistant  nausea and vomiting   Complete by: As directed    Call MD for:  redness, tenderness, or signs of infection (pain, swelling, redness, odor or green/yellow discharge around incision site)   Complete by: As directed    Diet - low sodium heart healthy   Complete by: As directed    Discharge instructions   Complete by: As directed    We recommend that everyone in your household and possibly those you may have had significant long term contact with receive the hepatitis A vaccine as soon as they are able.   You illness is typically very self limited although symptoms may persist for up to a few months. You will need to follow with you primary care doctor to have repeat labs to confirm that you liver is  recovering. Your symptom improvement may lag behind your lab improvement.   Please advance you diet slowly as tolerated, I would recommend avoiding large fatty meals for the time being and instead consume 3-4 small meals daily as tolerated.  If your any reason your symptoms were to worsen, please contact your primary care doctor or return to the hospital.  Ref Range & Units Total Protein 6.5 - 8.1 g/dL 5.1Low   5.4Low   5.4Low   6.0Low   Albumin 3.5 - 5.0 g/dL 2.2Low   2.4Low   2.4Low   2.7Low   AST 15 - 41 U/L 1,497High   2,615High  CM  2,780High  CM  3,527High  CM  ALT 0 - 44 U/L 2,202High   3,120High  CM  3,100High  CM  3,623High  CM  Alkaline Phosphatase 38 - 126 U/L 150High   175High   172High   196High   Total Bilirubin 0.3 - 1.2 mg/dL 11.0High   10.5High   9.3High   10.4High   Bilirubin, Direct 0.0 - 0.2 mg/dL 6.6High   6.4High   5.4High    Indirect Bilirubin 0.3 - 0.9 mg/dL 4.4High   4.1High  CM  3.9High  CM   Increase activity slowly   Complete by: As directed        Signed: Kathi Ludwig, MD 01/07/2019, 1:28 PM   Pager: # [phone]

## 2019-01-07 NOTE — Discharge Instructions (Signed)
?Handwashing (including after using the bathroom, changing diapers, and before preparing or eating foods). ?Avoiding tap water and raw foods in areas with poor sanitation. ?Heating foods appropriately (the virus can be inactivated by heating to >185F [>85C] for one minute). Cooked foods can transmit HAV if the temperature during food preparation is inadequate to kill the virus or if food is contaminated after cooking. ?Chlorine, iodine, and disinfecting solutions (household bleach 1:100 dilution) are effective for inactivation of HAV.   Hepatitis A  Hepatitis A is a viral infection of the liver. The virus causes inflammation in the liver and it can be passed from person to person (is contagious). Most cases of hepatitis A are fairly mild and people recover fully. The hepatitis A vaccine can prevent this condition. What are the causes? This condition is caused by the hepatitis A virus (HAV). The virus may be spread by:  Drinking or eating unclean (contaminated) food or water.  Having sex with with someone who is infected.  Coming into contact with the stool (feces) of a person who is infected and passing the virus from your hands to your mouth. What increases the risk? The following factors make you more likely to develop this condition:  Having contact with contaminated needles or syringes. This may happen during: ? Acupuncture. ? Tattooing. ? Body piercing. ? Injecting drugs.  Not having access to clean water or food.  Working at a day care or nursing home. Working in these facilities increases the risk of getting this infection because you are in contact with feces during diaper changes or general hygiene practices.  Having HIV (human immunodeficiency virus) or AIDS (acquired immunodeficiency syndrome).  Living in or traveling to countries where hepatitis A is common.  Being a man who has sex with men.  Having oral or anal sex.  Having hemophilia or another blood clotting  factor disorder.  Having long-term (chronic) liver disease. What are the signs or symptoms? Symptoms of this condition include:  Loss of appetite.  Fatigue.  Nausea.  Vomiting.  Stomach pain.  Dark yellow urine.  Yellowing of the skin and eyes (jaundice).  Fever.  Itchy skin.  Light-colored bowel movements.  Joint pain. In some cases, you may not have any symptoms. How is this diagnosed? This condition is diagnosed based on:  A physical exam.  Your medical history.  Blood tests. How is this treated? This condition usually goes away on its own over several weeks or months. There is no specific treatment for the disease after the virus has caused the infection. Severe cases of hepatitis A may require hospitalization to treat dehydration and to monitor liver function, but this is rare. Follow these instructions at home: Medicines  Take over-the-counter and prescription medicines only as told by your health care provider.  Do not take over-the-counter medicines that contain acetaminophen.  Do not take any new medicines, including over-the-counter medicines and supplements, unless approved by your health care provider. Lifestyle   Rest. Make sure you: ? Get plenty of sleep. Avoid staying up late. ? Keep the same bedtime hours on weekends and weekdays. ? Take daytime naps or rest breaks when you feel tired.  Do not have sex unless approved by your health care provider.  Eat a balanced diet with plenty of fruits and vegetables, whole grains, and low-fat (lean) meats or other non-meat proteins (such as beans or tofu).  Do not drink alcohol until your health care provider approves. General instructions  Wash your hands frequently with  soap and water, especially after using the bathroom, after changing diapers, and before handling food or water. If soap and water are not available, use hand sanitizer.  Tell your health care provider about all of the people you  live with or with whom you have close contact. Your health care provider may recommend that they receive the hepatitis A vaccine.  Follow your health care providers instructions about how to avoid spreading the virus.  Ask your health care provider when you may return to school or work.  Keep all follow-up visits as told by your health care provider. This is important. How is this prevented?  Get the hepatitis A vaccine. This helps prevent the hepatitis A infection.  If you have been recently exposed to hepatitis A, your health care provider may recommend that you get a shot of human immunoglobulin or the hepatitis A vaccine. This may help you prevent hepatitis A.  Wash your hands frequently with soap and water, especially after using the bathroom or changing diapers, and before handling food or water. If soap and water are not available, use hand sanitizer.  If you travel to a developing country: ? Avoid raw or under-cooked food. ? Drink bottled water only. ? Use bottled water to brush your teeth, make ice cubes, and wash fruits and vegetables.  Practice safe sex. Always use condoms when having oral, vaginal, or anal sex. Contact a health care provider if:  You have a fever.  Your symptoms get worse. Get help right away if:  You are unable to eat or drink.  You cannot eat or drink without vomiting.  You feel confused.  Your jaundice gets worse.  You are very sleepy or have trouble waking up.  You have uncontrolled bleeding or bruising. Summary  Hepatitis A is a viral infection of the liver. The virus causes inflammation in the liver and it can be passed from person to person (is contagious).  The hepatitis A virus (HAV) can be spread by drinking or eating unclean (contaminated) food or water.  You should not take any new medicines, including over-the-counter medicines and supplements, unless they are approved by your health care provider.  To help prevent hepatitis A,  wash your hands frequently with soap and water, especially after using the bathroom or changing diapers, and before handling food or water. If soap and water are not available, use hand sanitizer. This information is not intended to replace advice given to you by your health care provider. Make sure you discuss any questions you have with your health care provider. Document Released: 04/02/2000 Document Revised: 03/18/2017 Document Reviewed: 05/11/2016 Elsevier Patient Education  2020 [employer].

## 2019-01-07 NOTE — Plan of Care (Signed)
Pt for discharge going home, discontinued peripheral IV line, alert and oriented, ambulatory, able to tolerates his food, no complain of pain , given all his personal belongings, given health teachings, next appointment and due med explained and understood, wife at the bedside.

## 2019-01-07 NOTE — Plan of Care (Signed)
  Problem: Pain Managment: Goal: General experience of comfort will improve Outcome: Progressing   Problem: Safety: Goal: Ability to remain free from injury will improve Outcome: Progressing   Problem: Skin Integrity: Goal: Risk for impaired skin integrity will decrease Outcome: Progressing   

## 2019-01-08 LAB — ANTI-SMOOTH MUSCLE ANTIBODY, IGG: F-Actin IgG: 10 Units (ref 0–19)

## 2019-01-09 DIAGNOSIS — Z6822 Body mass index (BMI) 22.0-22.9, adult: Secondary | ICD-10-CM | POA: Diagnosis not present

## 2019-01-09 DIAGNOSIS — B159 Hepatitis A without hepatic coma: Secondary | ICD-10-CM | POA: Diagnosis not present

## 2019-01-09 DIAGNOSIS — N401 Enlarged prostate with lower urinary tract symptoms: Secondary | ICD-10-CM | POA: Diagnosis not present

## 2019-01-09 DIAGNOSIS — N138 Other obstructive and reflux uropathy: Secondary | ICD-10-CM | POA: Diagnosis not present

## 2019-01-09 LAB — CMV DNA, QUANTITATIVE, PCR
CMV DNA Quant: NEGATIVE IU/mL
Log10 CMV Qn DNA Pl: UNDETERMINED log10 IU/mL

## 2019-01-09 LAB — ANTI-MICROSOMAL ANTIBODY LIVER / KIDNEY: LKM1 Ab: 0.8 Units (ref 0.0–20.0)

## 2019-01-09 LAB — ANTINUCLEAR ANTIBODIES, IFA: ANA Ab, IFA: NEGATIVE

## 2019-01-09 LAB — PATHOLOGIST SMEAR REVIEW: Path Review: REACTIVE

## 2019-01-15 DIAGNOSIS — B159 Hepatitis A without hepatic coma: Secondary | ICD-10-CM | POA: Diagnosis not present

## 2019-01-22 DIAGNOSIS — B159 Hepatitis A without hepatic coma: Secondary | ICD-10-CM | POA: Diagnosis not present

## 2019-01-23 DIAGNOSIS — N400 Enlarged prostate without lower urinary tract symptoms: Secondary | ICD-10-CM | POA: Diagnosis not present

## 2019-01-23 DIAGNOSIS — N528 Other male erectile dysfunction: Secondary | ICD-10-CM | POA: Diagnosis not present

## 2019-01-23 DIAGNOSIS — R350 Frequency of micturition: Secondary | ICD-10-CM | POA: Diagnosis not present

## 2019-01-23 DIAGNOSIS — R3915 Urgency of urination: Secondary | ICD-10-CM | POA: Diagnosis not present

## 2019-01-31 ENCOUNTER — Ambulatory Visit: Payer: PPO | Admitting: Allergy and Immunology

## 2019-02-12 DIAGNOSIS — B159 Hepatitis A without hepatic coma: Secondary | ICD-10-CM | POA: Diagnosis not present

## 2019-02-15 DIAGNOSIS — N401 Enlarged prostate with lower urinary tract symptoms: Secondary | ICD-10-CM | POA: Diagnosis not present

## 2019-02-15 DIAGNOSIS — R3915 Urgency of urination: Secondary | ICD-10-CM | POA: Diagnosis not present

## 2019-02-15 DIAGNOSIS — R350 Frequency of micturition: Secondary | ICD-10-CM | POA: Diagnosis not present

## 2019-03-05 DIAGNOSIS — B159 Hepatitis A without hepatic coma: Secondary | ICD-10-CM | POA: Diagnosis not present

## 2019-03-09 DIAGNOSIS — K200 Eosinophilic esophagitis: Secondary | ICD-10-CM | POA: Diagnosis not present

## 2019-03-09 DIAGNOSIS — B159 Hepatitis A without hepatic coma: Secondary | ICD-10-CM | POA: Diagnosis not present

## 2019-03-09 DIAGNOSIS — K219 Gastro-esophageal reflux disease without esophagitis: Secondary | ICD-10-CM | POA: Diagnosis not present

## 2019-03-26 DIAGNOSIS — Z01812 Encounter for preprocedural laboratory examination: Secondary | ICD-10-CM | POA: Diagnosis not present

## 2019-03-26 DIAGNOSIS — Z20828 Contact with and (suspected) exposure to other viral communicable diseases: Secondary | ICD-10-CM | POA: Diagnosis not present

## 2019-03-26 DIAGNOSIS — N400 Enlarged prostate without lower urinary tract symptoms: Secondary | ICD-10-CM | POA: Diagnosis not present

## 2019-04-02 DIAGNOSIS — N400 Enlarged prostate without lower urinary tract symptoms: Secondary | ICD-10-CM | POA: Diagnosis not present

## 2019-04-02 DIAGNOSIS — Z79899 Other long term (current) drug therapy: Secondary | ICD-10-CM | POA: Diagnosis not present

## 2019-04-02 DIAGNOSIS — N401 Enlarged prostate with lower urinary tract symptoms: Secondary | ICD-10-CM | POA: Diagnosis not present

## 2019-04-02 DIAGNOSIS — N138 Other obstructive and reflux uropathy: Secondary | ICD-10-CM | POA: Diagnosis not present

## 2019-04-02 DIAGNOSIS — R3914 Feeling of incomplete bladder emptying: Secondary | ICD-10-CM | POA: Diagnosis not present

## 2019-04-02 DIAGNOSIS — K219 Gastro-esophageal reflux disease without esophagitis: Secondary | ICD-10-CM | POA: Diagnosis not present

## 2019-04-06 DIAGNOSIS — Z466 Encounter for fitting and adjustment of urinary device: Secondary | ICD-10-CM | POA: Diagnosis not present

## 2019-04-06 DIAGNOSIS — R339 Retention of urine, unspecified: Secondary | ICD-10-CM | POA: Diagnosis not present

## 2019-04-09 DIAGNOSIS — B159 Hepatitis A without hepatic coma: Secondary | ICD-10-CM | POA: Diagnosis not present

## 2019-05-15 DIAGNOSIS — Z87438 Personal history of other diseases of male genital organs: Secondary | ICD-10-CM | POA: Diagnosis not present

## 2019-05-15 DIAGNOSIS — N401 Enlarged prostate with lower urinary tract symptoms: Secondary | ICD-10-CM | POA: Diagnosis not present

## 2019-09-21 DIAGNOSIS — K200 Eosinophilic esophagitis: Secondary | ICD-10-CM | POA: Diagnosis not present

## 2019-11-28 DIAGNOSIS — D485 Neoplasm of uncertain behavior of skin: Secondary | ICD-10-CM | POA: Diagnosis not present

## 2019-11-28 DIAGNOSIS — L821 Other seborrheic keratosis: Secondary | ICD-10-CM | POA: Diagnosis not present

## 2019-12-29 DIAGNOSIS — L03019 Cellulitis of unspecified finger: Secondary | ICD-10-CM | POA: Diagnosis not present

## 2019-12-31 DIAGNOSIS — Z0000 Encounter for general adult medical examination without abnormal findings: Secondary | ICD-10-CM | POA: Diagnosis not present

## 2019-12-31 DIAGNOSIS — Z1331 Encounter for screening for depression: Secondary | ICD-10-CM | POA: Diagnosis not present

## 2019-12-31 DIAGNOSIS — Z6823 Body mass index (BMI) 23.0-23.9, adult: Secondary | ICD-10-CM | POA: Diagnosis not present

## 2019-12-31 DIAGNOSIS — N138 Other obstructive and reflux uropathy: Secondary | ICD-10-CM | POA: Diagnosis not present

## 2019-12-31 DIAGNOSIS — R739 Hyperglycemia, unspecified: Secondary | ICD-10-CM | POA: Diagnosis not present

## 2019-12-31 DIAGNOSIS — R1310 Dysphagia, unspecified: Secondary | ICD-10-CM | POA: Diagnosis not present

## 2019-12-31 DIAGNOSIS — Z79899 Other long term (current) drug therapy: Secondary | ICD-10-CM | POA: Diagnosis not present

## 2019-12-31 DIAGNOSIS — E785 Hyperlipidemia, unspecified: Secondary | ICD-10-CM | POA: Diagnosis not present

## 2019-12-31 DIAGNOSIS — N401 Enlarged prostate with lower urinary tract symptoms: Secondary | ICD-10-CM | POA: Diagnosis not present

## 2019-12-31 DIAGNOSIS — R972 Elevated prostate specific antigen [PSA]: Secondary | ICD-10-CM | POA: Diagnosis not present

## 2019-12-31 DIAGNOSIS — N529 Male erectile dysfunction, unspecified: Secondary | ICD-10-CM | POA: Diagnosis not present

## 2020-01-01 DIAGNOSIS — M5136 Other intervertebral disc degeneration, lumbar region: Secondary | ICD-10-CM | POA: Diagnosis not present

## 2020-01-01 DIAGNOSIS — M25551 Pain in right hip: Secondary | ICD-10-CM | POA: Diagnosis not present

## 2020-03-07 DIAGNOSIS — Z23 Encounter for immunization: Secondary | ICD-10-CM | POA: Diagnosis not present

## 2020-03-18 DIAGNOSIS — R972 Elevated prostate specific antigen [PSA]: Secondary | ICD-10-CM | POA: Diagnosis not present

## 2020-03-26 DIAGNOSIS — K200 Eosinophilic esophagitis: Secondary | ICD-10-CM | POA: Diagnosis not present

## 2020-03-26 DIAGNOSIS — R1310 Dysphagia, unspecified: Secondary | ICD-10-CM | POA: Diagnosis not present

## 2020-04-24 DIAGNOSIS — K200 Eosinophilic esophagitis: Secondary | ICD-10-CM | POA: Diagnosis not present

## 2020-04-24 DIAGNOSIS — K219 Gastro-esophageal reflux disease without esophagitis: Secondary | ICD-10-CM | POA: Diagnosis not present

## 2020-04-28 DIAGNOSIS — N418 Other inflammatory diseases of prostate: Secondary | ICD-10-CM | POA: Diagnosis not present

## 2020-04-28 DIAGNOSIS — R972 Elevated prostate specific antigen [PSA]: Secondary | ICD-10-CM | POA: Diagnosis not present

## 2020-06-16 DIAGNOSIS — N400 Enlarged prostate without lower urinary tract symptoms: Secondary | ICD-10-CM | POA: Diagnosis not present

## 2020-06-16 DIAGNOSIS — R972 Elevated prostate specific antigen [PSA]: Secondary | ICD-10-CM | POA: Diagnosis not present

## 2021-01-07 DIAGNOSIS — R972 Elevated prostate specific antigen [PSA]: Secondary | ICD-10-CM | POA: Diagnosis not present

## 2021-01-07 DIAGNOSIS — N401 Enlarged prostate with lower urinary tract symptoms: Secondary | ICD-10-CM | POA: Diagnosis not present

## 2021-01-07 DIAGNOSIS — Z6823 Body mass index (BMI) 23.0-23.9, adult: Secondary | ICD-10-CM | POA: Diagnosis not present

## 2021-01-07 DIAGNOSIS — R1310 Dysphagia, unspecified: Secondary | ICD-10-CM | POA: Diagnosis not present

## 2021-01-07 DIAGNOSIS — Z2820 Immunization not carried out because of patient decision for unspecified reason: Secondary | ICD-10-CM | POA: Diagnosis not present

## 2021-01-07 DIAGNOSIS — N5201 Erectile dysfunction due to arterial insufficiency: Secondary | ICD-10-CM | POA: Diagnosis not present

## 2021-01-07 DIAGNOSIS — Z9181 History of falling: Secondary | ICD-10-CM | POA: Diagnosis not present

## 2021-01-07 DIAGNOSIS — E785 Hyperlipidemia, unspecified: Secondary | ICD-10-CM | POA: Diagnosis not present

## 2021-01-07 DIAGNOSIS — Z0000 Encounter for general adult medical examination without abnormal findings: Secondary | ICD-10-CM | POA: Diagnosis not present

## 2021-01-07 DIAGNOSIS — N138 Other obstructive and reflux uropathy: Secondary | ICD-10-CM | POA: Diagnosis not present

## 2021-03-04 DIAGNOSIS — Z23 Encounter for immunization: Secondary | ICD-10-CM | POA: Diagnosis not present

## 2021-06-09 DIAGNOSIS — N401 Enlarged prostate with lower urinary tract symptoms: Secondary | ICD-10-CM | POA: Diagnosis not present

## 2021-06-09 DIAGNOSIS — R972 Elevated prostate specific antigen [PSA]: Secondary | ICD-10-CM | POA: Diagnosis not present

## 2021-06-26 DIAGNOSIS — R972 Elevated prostate specific antigen [PSA]: Secondary | ICD-10-CM | POA: Diagnosis not present

## 2021-08-10 IMAGING — CT CT ABD-PELV W/ CM
2 of 5 series · 16 of 46 positions shown, 18 images · IV contrast (APPLIED)
Comparison: None.

CLINICAL DATA: Elevated LFTs

EXAM:
CT ABDOMEN AND PELVIS WITH CONTRAST
TECHNIQUE: Multidetector CT imaging of the abdomen and pelvis was performed
using the standard protocol following bolus administration of
intravenous contrast.
CONTRAST:  100mL OMNIPAQUE IOHEXOL 300 MG/ML  SOLN

[Series 3: abdomen 5.0 · axial · 0.75mm/px · z∈[+673,+1058]mm · 13 of 89 slices shown, 15 images]
[im 6/89  soft-tissue]
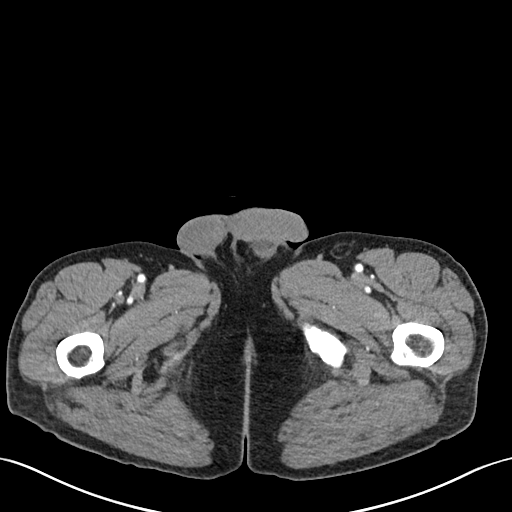
[im 6/89  bone]
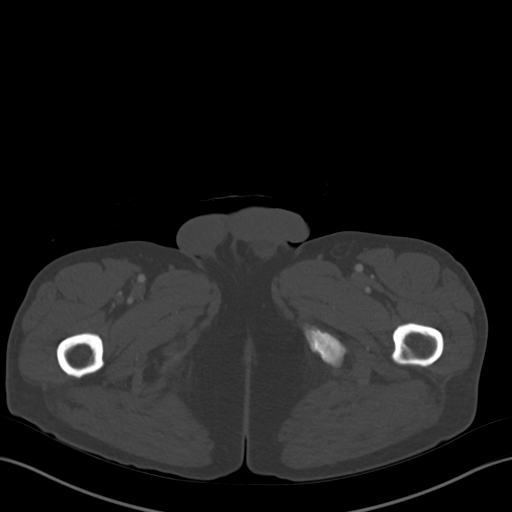
[im 12/89  soft-tissue]
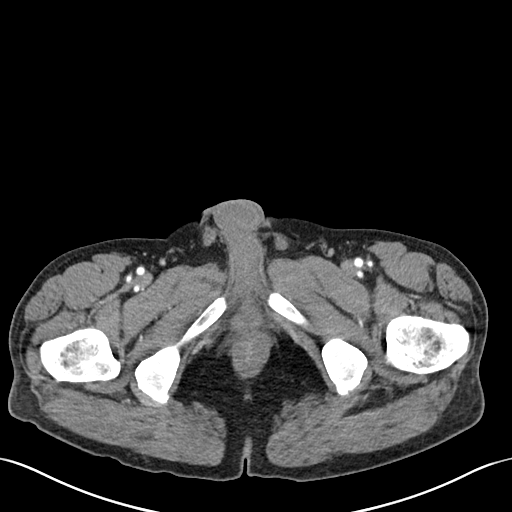
[im 18/89  soft-tissue]
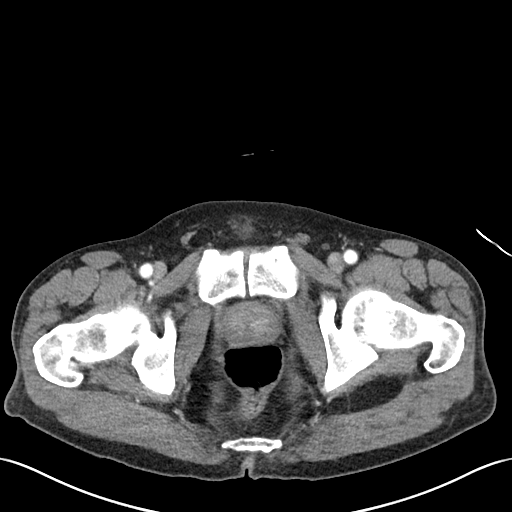
[im 24/89  soft-tissue]
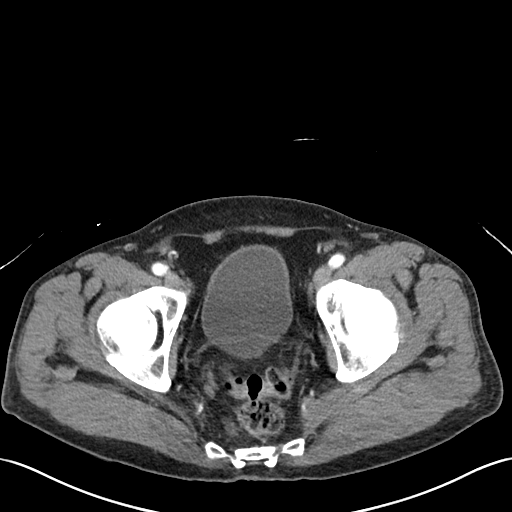
[im 30/89  soft-tissue]
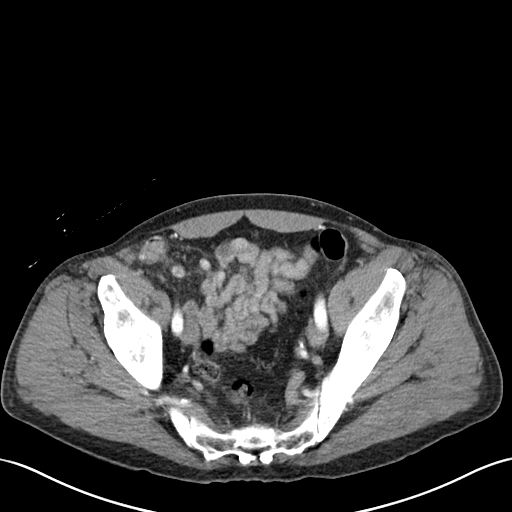
[im 36/89  soft-tissue]
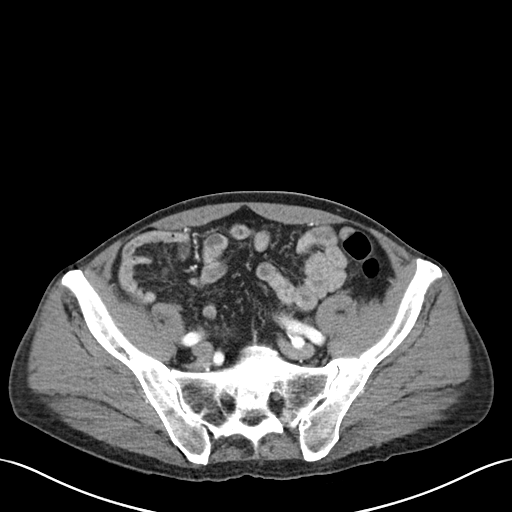
[im 47/89  soft-tissue]
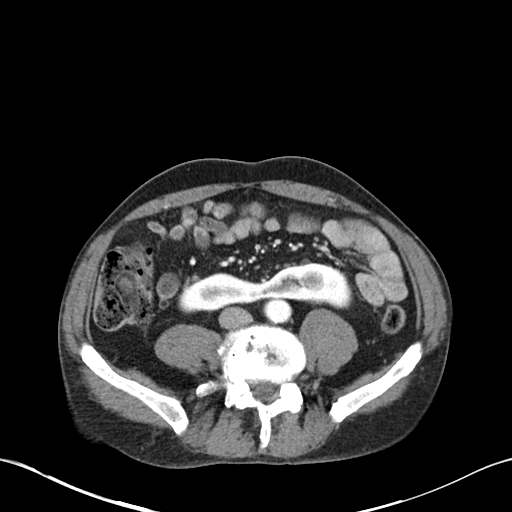
[im 53/89  soft-tissue]
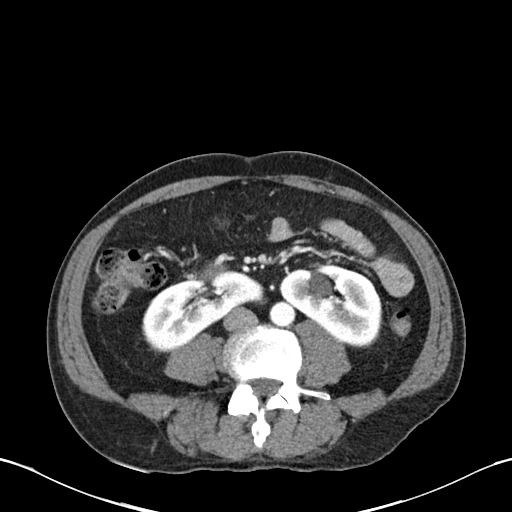
[im 59/89  soft-tissue]
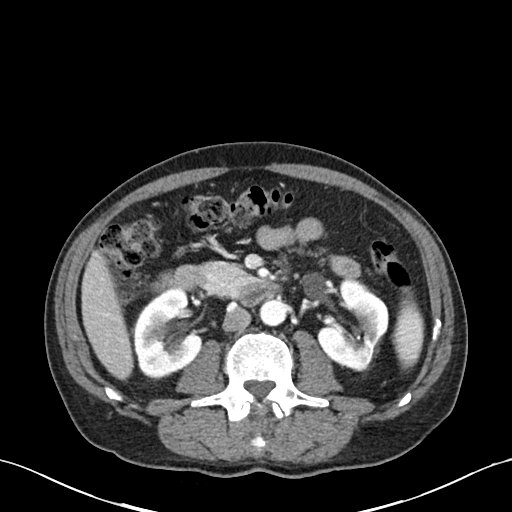
[im 59/89  bone]
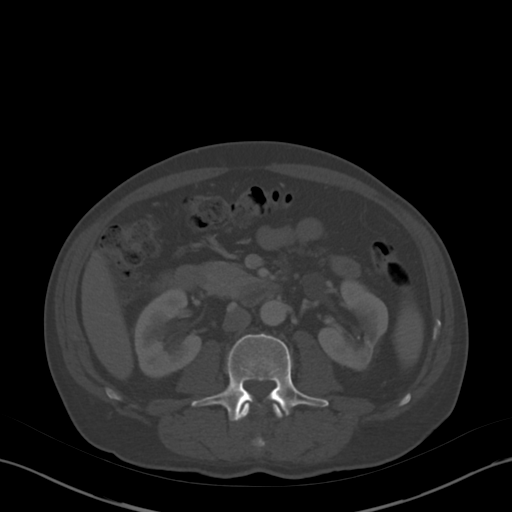
[im 65/89  soft-tissue]
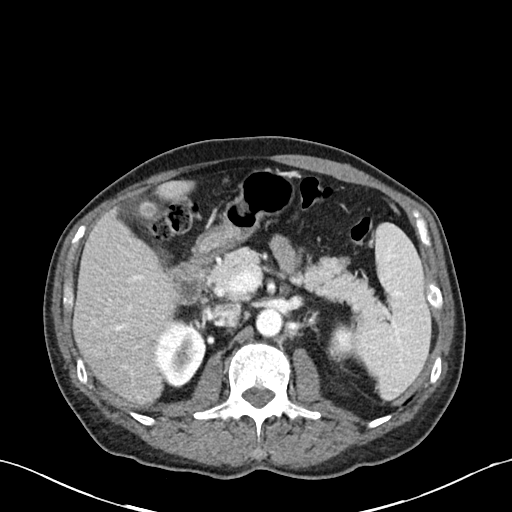
[im 71/89  soft-tissue]
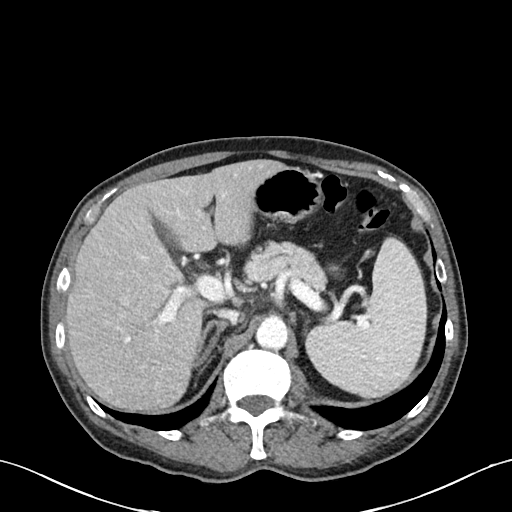
[im 77/89  soft-tissue]
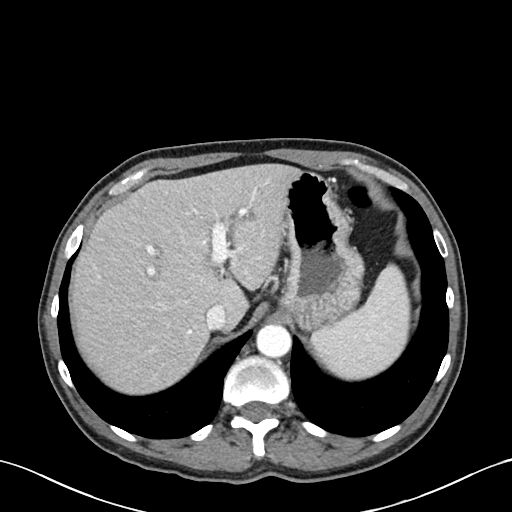
[im 83/89  soft-tissue]
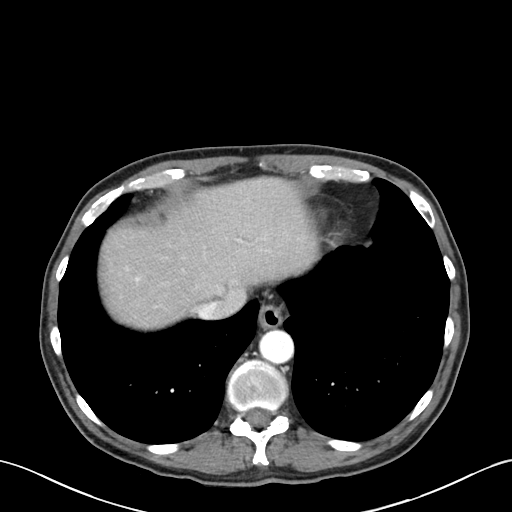

[Series 6: abdomen 3.0 mpr cor · coronal · 0.75mm/px · 3 of 89 slices shown]
[im 30/89  soft-tissue]
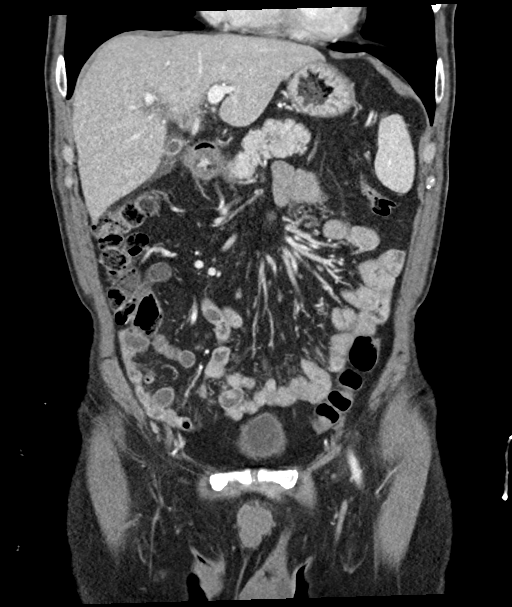
[im 40/89  soft-tissue]
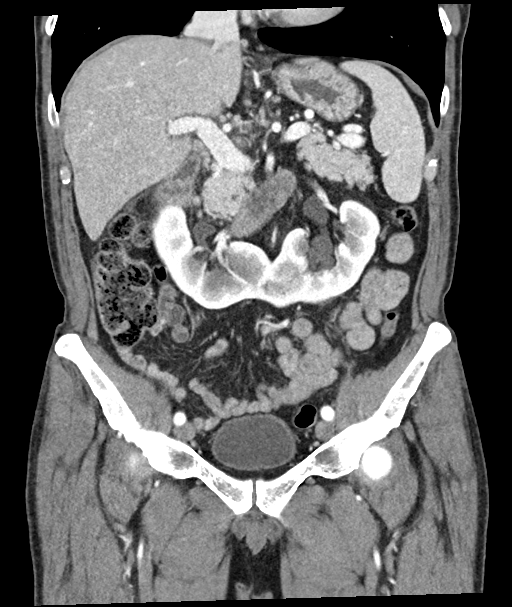
[im 49/89  soft-tissue]
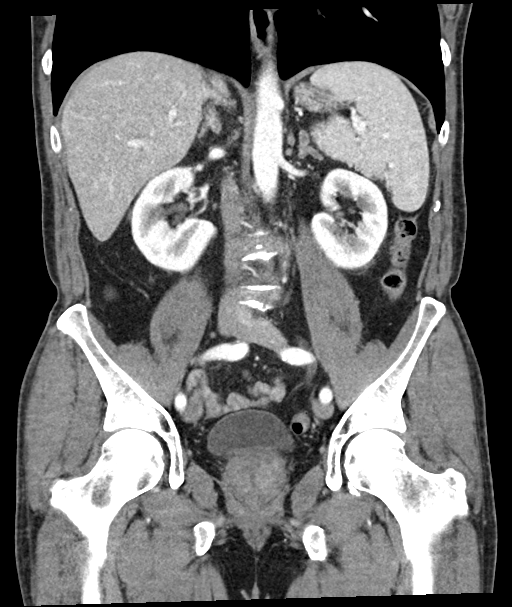

[16 of 46 positions shown; findings below may reference images not displayed]

FINDINGS: Lower chest: Lung bases are clear.

Hepatobiliary: Two probable cysts measuring up to 5 mm (series
3/image 10). No suspicious/enhancing hepatic lesions.

Contracted gallbladder with gallbladder wall thickening/edema. In
the setting of abnormal LFTs, this appearance may be secondary to
acute hepatic inflammation/hepatitis.

No intrahepatic or extrahepatic ductal dilatation.

Pancreas: Within normal limits.

Spleen: Within normal limits.

Adrenals/Urinary Tract: Adrenal glands are within normal limits.

Horseshoe kidney with mild parenchymal bridging centrally (series
3/image 41). Mild cortical scarring in the lateral left upper pole
with underlying 7 x 3 mm left upper pole renal calculus (series
3/image 32). Additional punctate left lower pole renal calculus
(series 3/image 40). No frank hydronephrosis.

Bladder is within normal limits.

Stomach/Bowel: Stomach is within normal limits.

No evidence of bowel obstruction.

Appendix is not discretely visualized.

Vascular/Lymphatic: No evidence of abdominal aortic aneurysm.

Mild atherosclerotic calcifications of the abdominal aorta and
branch vessels.

No suspicious abdominopelvic lymphadenopathy.

Reproductive: Prostate is unremarkable.

Other: No abdominopelvic ascites.

Tiny fat containing right inguinal hernia.

Musculoskeletal: Mild degenerative changes at L4-5.
IMPRESSION: Two small hepatic cysts measuring up to 5 mm. No suspicious hepatic
lesions on CT.

No intrahepatic or extrahepatic ductal dilatation.

Contracted gallbladder with gallbladder wall thickening/edema. In
the setting of abnormal LFTs, this appearance may be secondary to
acute hepatic inflammation/hepatitis.

Horseshoe kidney. Two nonobstructing left renal calculi measuring up
to 7 mm. No hydronephrosis.

## 2021-08-10 IMAGING — US US HEPATIC LIVER DOPPLER
2 series · 13 of 25 positions shown · non-contrast
Comparison: CT 01/05/2019

CLINICAL DATA: Elevated LFT and bilirubin, acute hepatitis

EXAM:
DUPLEX ULTRASOUND OF LIVER
ULTRASOUND ABDOMEN LIMITED RIGHT UPPER QUADRANT
TECHNIQUE: Color and duplex Doppler ultrasound was performed to evaluate the
hepatic in-flow and out-flow vessels.

[Series 1: us hepatic liver doppler · 12 of 89 slices shown (1 of 2)]
[im 1/89]
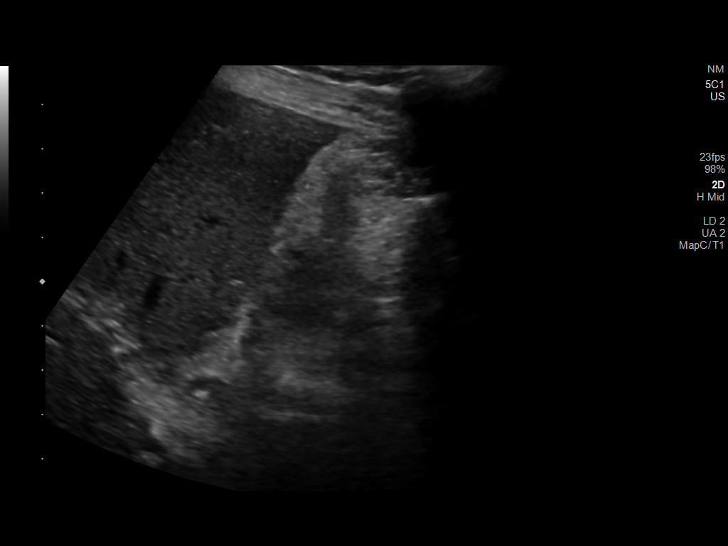
[im 8/89]
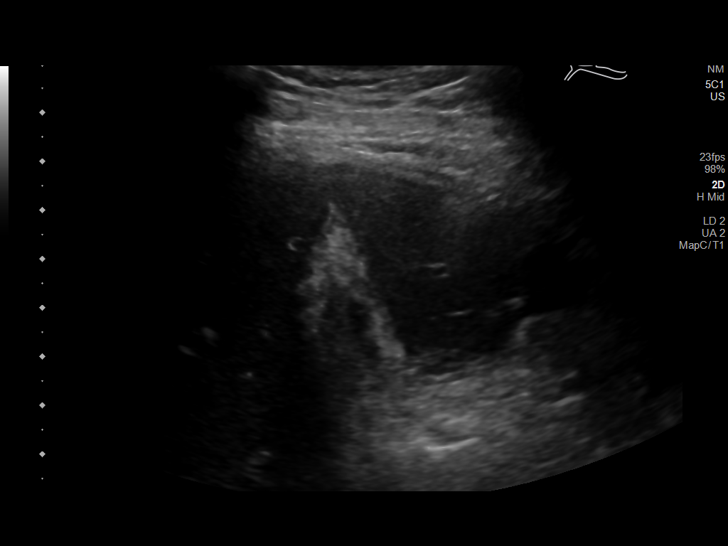
[im 16/89]
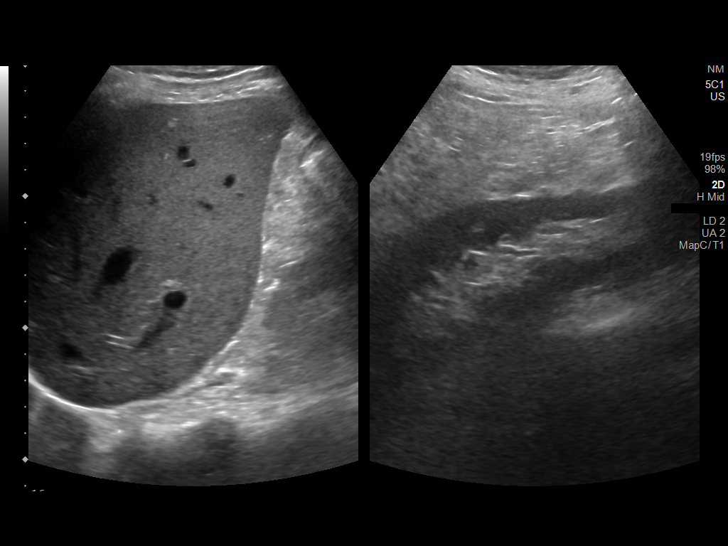
[im 23/89]
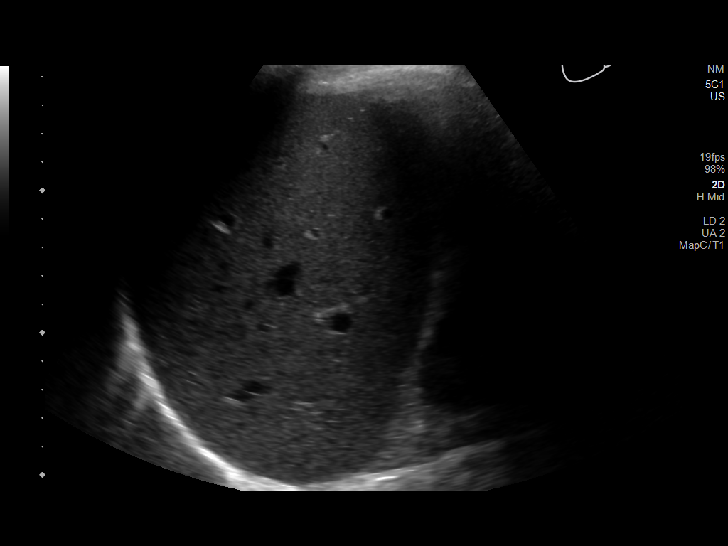
[im 31/89]
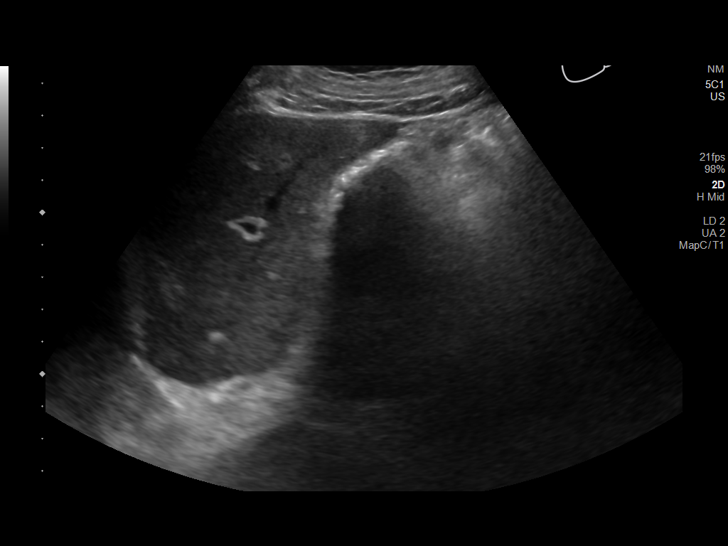
[im 39/89]
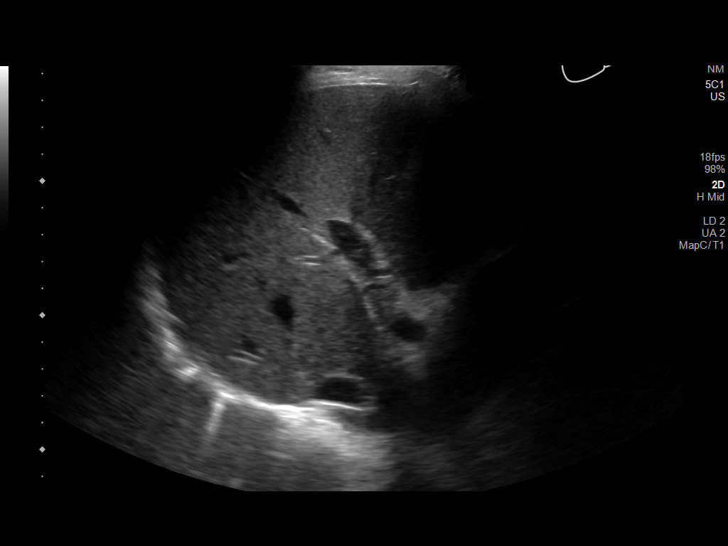
[im 46/89]
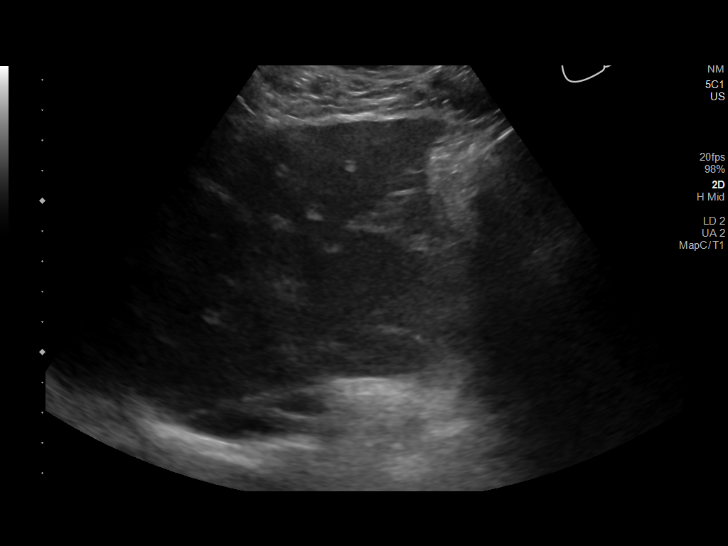
[im 54/89]
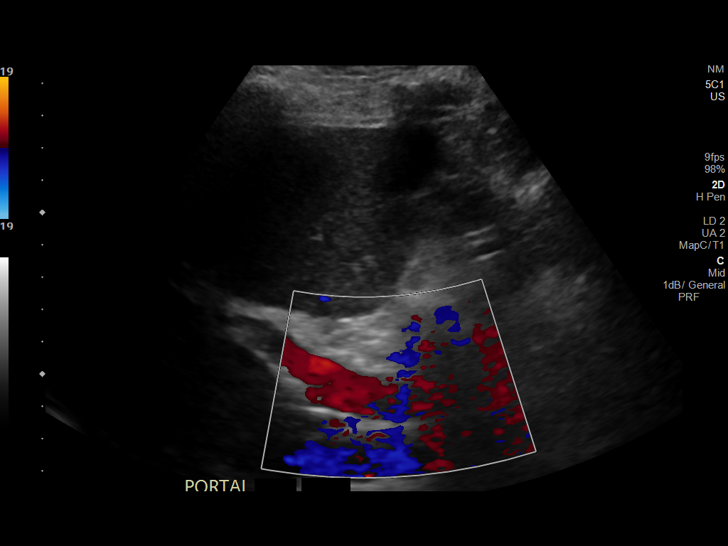
[im 62/89]
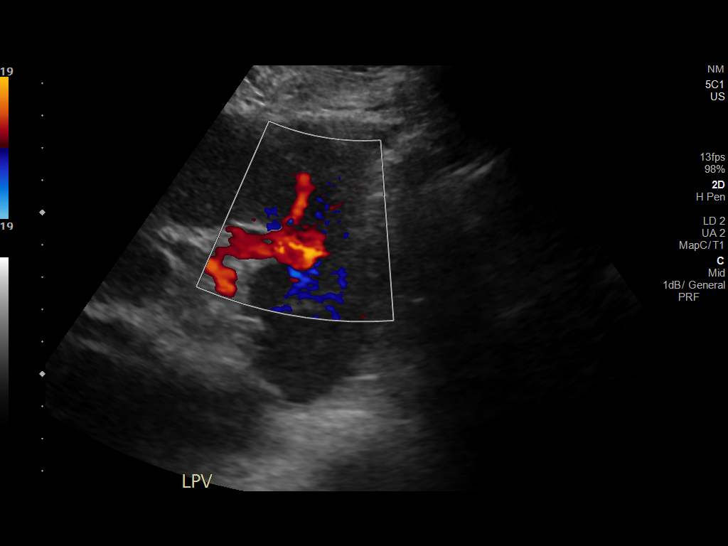
[im 69/89]
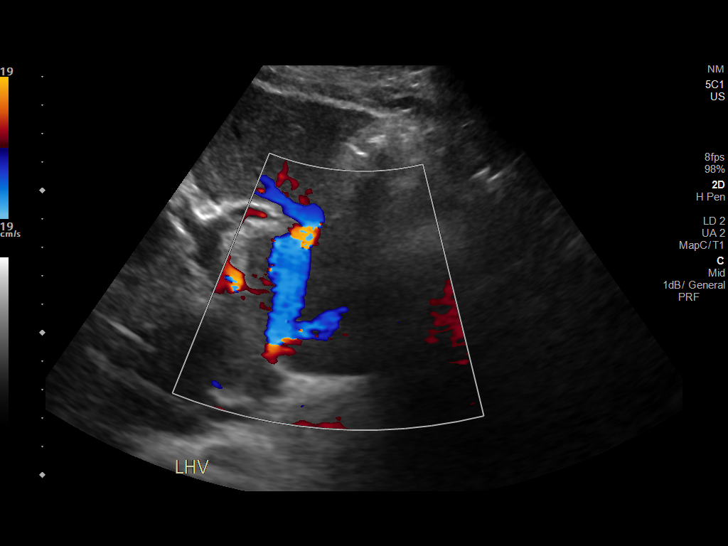
[im 77/89]
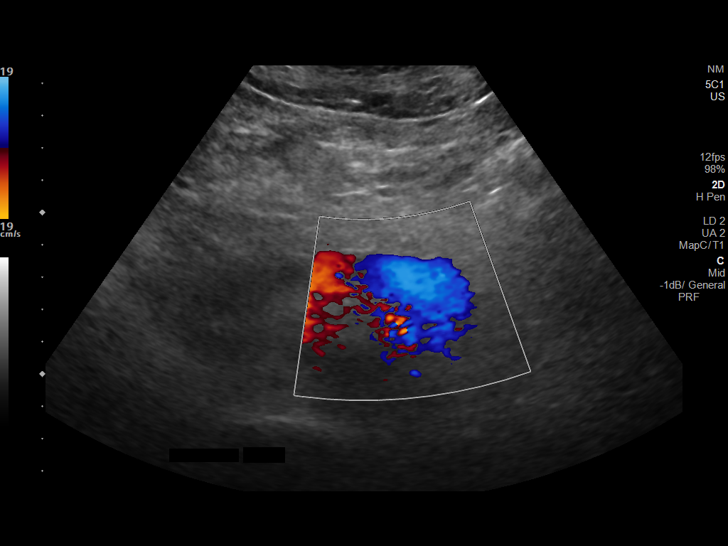
[im 85/89]
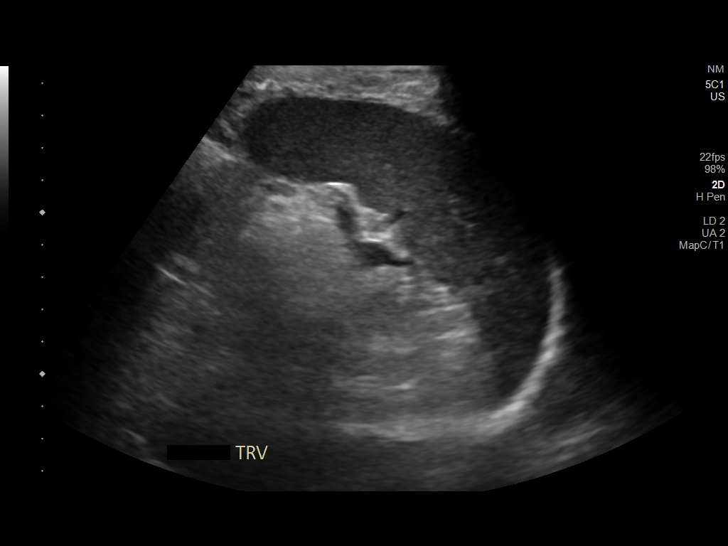

[Series 1001: us hepatic liver doppler · 1 of 1 slices shown (2 of 2)]
[im 1/1]
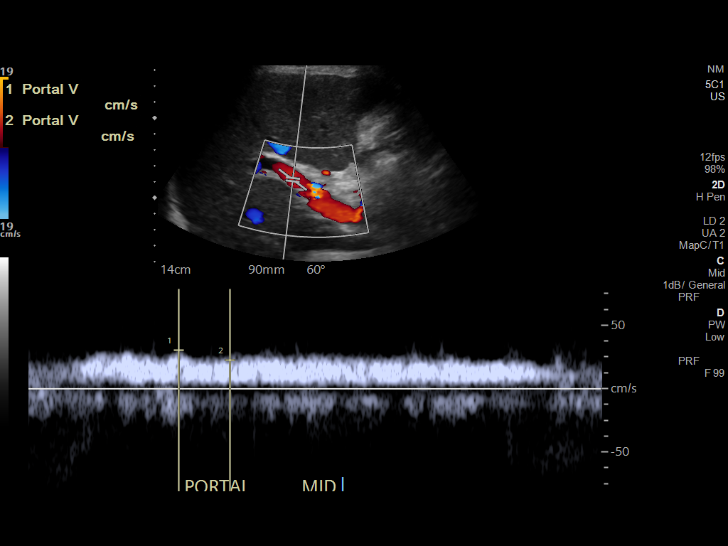

[13 of 25 positions shown; findings below may reference images not displayed]

FINDINGS (DOPPLER):
Liver: Slightly echogenic. Normal hepatic contour without
nodularity.

No focal lesion, mass or intrahepatic biliary ductal dilatation.

Main Portal Vein size: 1 cm

Portal Vein Velocities

Main Prox:  33 cm/sec

Main Mid: 30.3 cm/sec

Main Dist:  26.5 cm/sec
Right: 32.4 cm/sec
Left: 19.1 cm/sec

Hepatic Vein Velocities

Right:  57.6 cm/sec

Middle:  16.4 cm/sec

Left:  34.7 cm/sec

IVC: Present and patent with normal respiratory phasicity.

Hepatic Artery Velocity:  129.3 cm/sec

Splenic Vein Velocity:  46.1 cm/sec

Spleen: 12.4 cm x 4.3 cm x 4 cm with a total volume of 112.2 cm^3
(411 cm^3 is upper limit normal)

Portal Vein Occlusion/Thrombus: No

Splenic Vein Occlusion/Thrombus: No

Ascites: None

Varices: None

FINDINGS (RUQ US):
Gallbladder:

Poorly visible, appears to be contracted. May contain intraluminal
sludge and small stones. Wall thickness measures less than 3 mm. No
pericholecystic fluid. Negative sonographic Murphy.

Common bile duct:

Diameter: Up to 2.2 mm

Liver:

Describe small hypodensities in the liver on CT are not well seen by
ultrasound. Liver may be slightly echogenic. Portal vein is patent
on color Doppler imaging with normal direction of blood flow towards
the liver.

Other: None.
IMPRESSION: 1. Markedly contracted gallbladder which is poorly visualized.
Gallbladder may contain intraluminal sludge and small stones. No
definite sonographic features to suggest acute cholecystitis. No
biliary enlargement.
2. Slightly echogenic liver which may be due to steatosis and or
hepatocellular disease
3. Negative Doppler ultrasound of the liver. Hepatic and portal
venous systems are patent and without thrombosis.

## 2021-09-01 DIAGNOSIS — H31001 Unspecified chorioretinal scars, right eye: Secondary | ICD-10-CM | POA: Diagnosis not present

## 2021-09-01 DIAGNOSIS — H524 Presbyopia: Secondary | ICD-10-CM | POA: Diagnosis not present

## 2021-09-01 DIAGNOSIS — H25813 Combined forms of age-related cataract, bilateral: Secondary | ICD-10-CM | POA: Diagnosis not present

## 2022-02-03 DIAGNOSIS — E785 Hyperlipidemia, unspecified: Secondary | ICD-10-CM | POA: Diagnosis not present

## 2022-02-03 DIAGNOSIS — Z9181 History of falling: Secondary | ICD-10-CM | POA: Diagnosis not present

## 2022-02-03 DIAGNOSIS — R1310 Dysphagia, unspecified: Secondary | ICD-10-CM | POA: Diagnosis not present

## 2022-02-03 DIAGNOSIS — Z1331 Encounter for screening for depression: Secondary | ICD-10-CM | POA: Diagnosis not present

## 2022-02-03 DIAGNOSIS — N138 Other obstructive and reflux uropathy: Secondary | ICD-10-CM | POA: Diagnosis not present

## 2022-02-03 DIAGNOSIS — Z6824 Body mass index (BMI) 24.0-24.9, adult: Secondary | ICD-10-CM | POA: Diagnosis not present

## 2022-02-03 DIAGNOSIS — Z0000 Encounter for general adult medical examination without abnormal findings: Secondary | ICD-10-CM | POA: Diagnosis not present

## 2022-02-03 DIAGNOSIS — R7301 Impaired fasting glucose: Secondary | ICD-10-CM | POA: Diagnosis not present

## 2022-02-03 DIAGNOSIS — Z2821 Immunization not carried out because of patient refusal: Secondary | ICD-10-CM | POA: Diagnosis not present

## 2022-02-03 DIAGNOSIS — N401 Enlarged prostate with lower urinary tract symptoms: Secondary | ICD-10-CM | POA: Diagnosis not present

## 2022-02-03 DIAGNOSIS — R972 Elevated prostate specific antigen [PSA]: Secondary | ICD-10-CM | POA: Diagnosis not present

## 2022-02-09 DIAGNOSIS — R9721 Rising PSA following treatment for malignant neoplasm of prostate: Secondary | ICD-10-CM | POA: Diagnosis not present

## 2022-02-09 DIAGNOSIS — Z23 Encounter for immunization: Secondary | ICD-10-CM | POA: Diagnosis not present

## 2022-02-09 DIAGNOSIS — E785 Hyperlipidemia, unspecified: Secondary | ICD-10-CM | POA: Diagnosis not present

## 2022-02-09 DIAGNOSIS — R7301 Impaired fasting glucose: Secondary | ICD-10-CM | POA: Diagnosis not present

## 2022-05-18 DIAGNOSIS — Z23 Encounter for immunization: Secondary | ICD-10-CM | POA: Diagnosis not present

## 2022-05-18 DIAGNOSIS — R030 Elevated blood-pressure reading, without diagnosis of hypertension: Secondary | ICD-10-CM | POA: Diagnosis not present

## 2022-05-18 DIAGNOSIS — S61219A Laceration without foreign body of unspecified finger without damage to nail, initial encounter: Secondary | ICD-10-CM | POA: Diagnosis not present

## 2022-05-18 DIAGNOSIS — S61412A Laceration without foreign body of left hand, initial encounter: Secondary | ICD-10-CM | POA: Diagnosis not present

## 2022-07-01 DIAGNOSIS — Z800 Family history of malignant neoplasm of digestive organs: Secondary | ICD-10-CM | POA: Diagnosis not present

## 2022-07-01 DIAGNOSIS — K219 Gastro-esophageal reflux disease without esophagitis: Secondary | ICD-10-CM | POA: Diagnosis not present

## 2022-07-01 DIAGNOSIS — K200 Eosinophilic esophagitis: Secondary | ICD-10-CM | POA: Diagnosis not present

## 2023-02-21 DIAGNOSIS — N401 Enlarged prostate with lower urinary tract symptoms: Secondary | ICD-10-CM | POA: Diagnosis not present

## 2023-02-21 DIAGNOSIS — R3914 Feeling of incomplete bladder emptying: Secondary | ICD-10-CM | POA: Diagnosis not present

## 2023-02-22 DIAGNOSIS — Z9181 History of falling: Secondary | ICD-10-CM | POA: Diagnosis not present

## 2023-02-22 DIAGNOSIS — R972 Elevated prostate specific antigen [PSA]: Secondary | ICD-10-CM | POA: Diagnosis not present

## 2023-02-22 DIAGNOSIS — Z6823 Body mass index (BMI) 23.0-23.9, adult: Secondary | ICD-10-CM | POA: Diagnosis not present

## 2023-02-22 DIAGNOSIS — R1310 Dysphagia, unspecified: Secondary | ICD-10-CM | POA: Diagnosis not present

## 2023-02-22 DIAGNOSIS — Z1339 Encounter for screening examination for other mental health and behavioral disorders: Secondary | ICD-10-CM | POA: Diagnosis not present

## 2023-02-22 DIAGNOSIS — N138 Other obstructive and reflux uropathy: Secondary | ICD-10-CM | POA: Diagnosis not present

## 2023-02-22 DIAGNOSIS — E785 Hyperlipidemia, unspecified: Secondary | ICD-10-CM | POA: Diagnosis not present

## 2023-02-22 DIAGNOSIS — N401 Enlarged prostate with lower urinary tract symptoms: Secondary | ICD-10-CM | POA: Diagnosis not present

## 2023-02-22 DIAGNOSIS — Z23 Encounter for immunization: Secondary | ICD-10-CM | POA: Diagnosis not present

## 2023-02-22 DIAGNOSIS — Z1331 Encounter for screening for depression: Secondary | ICD-10-CM | POA: Diagnosis not present

## 2023-02-22 DIAGNOSIS — Z0000 Encounter for general adult medical examination without abnormal findings: Secondary | ICD-10-CM | POA: Diagnosis not present

## 2023-02-23 DIAGNOSIS — E785 Hyperlipidemia, unspecified: Secondary | ICD-10-CM | POA: Diagnosis not present

## 2023-02-23 DIAGNOSIS — R7301 Impaired fasting glucose: Secondary | ICD-10-CM | POA: Diagnosis not present

## 2023-03-08 DIAGNOSIS — H02413 Mechanical ptosis of bilateral eyelids: Secondary | ICD-10-CM | POA: Diagnosis not present

## 2023-03-08 DIAGNOSIS — H57813 Brow ptosis, bilateral: Secondary | ICD-10-CM | POA: Diagnosis not present

## 2023-03-08 DIAGNOSIS — H0279 Other degenerative disorders of eyelid and periocular area: Secondary | ICD-10-CM | POA: Diagnosis not present

## 2023-03-08 DIAGNOSIS — H02834 Dermatochalasis of left upper eyelid: Secondary | ICD-10-CM | POA: Diagnosis not present

## 2023-03-08 DIAGNOSIS — H53483 Generalized contraction of visual field, bilateral: Secondary | ICD-10-CM | POA: Diagnosis not present

## 2023-03-08 DIAGNOSIS — H02831 Dermatochalasis of right upper eyelid: Secondary | ICD-10-CM | POA: Diagnosis not present

## 2023-03-08 DIAGNOSIS — Z01818 Encounter for other preprocedural examination: Secondary | ICD-10-CM | POA: Diagnosis not present

## 2023-03-08 DIAGNOSIS — H02411 Mechanical ptosis of right eyelid: Secondary | ICD-10-CM | POA: Diagnosis not present

## 2023-03-08 DIAGNOSIS — H02412 Mechanical ptosis of left eyelid: Secondary | ICD-10-CM | POA: Diagnosis not present

## 2023-05-09 DIAGNOSIS — H53483 Generalized contraction of visual field, bilateral: Secondary | ICD-10-CM | POA: Diagnosis not present

## 2023-07-24 DIAGNOSIS — N201 Calculus of ureter: Secondary | ICD-10-CM | POA: Diagnosis not present

## 2023-07-24 DIAGNOSIS — R911 Solitary pulmonary nodule: Secondary | ICD-10-CM | POA: Diagnosis not present

## 2023-07-24 DIAGNOSIS — N132 Hydronephrosis with renal and ureteral calculous obstruction: Secondary | ICD-10-CM | POA: Diagnosis not present

## 2023-07-24 DIAGNOSIS — N200 Calculus of kidney: Secondary | ICD-10-CM | POA: Diagnosis not present

## 2023-07-26 DIAGNOSIS — N401 Enlarged prostate with lower urinary tract symptoms: Secondary | ICD-10-CM | POA: Diagnosis not present

## 2023-07-26 DIAGNOSIS — N201 Calculus of ureter: Secondary | ICD-10-CM | POA: Diagnosis not present

## 2023-07-26 DIAGNOSIS — N202 Calculus of kidney with calculus of ureter: Secondary | ICD-10-CM | POA: Diagnosis not present

## 2023-07-26 DIAGNOSIS — R1032 Left lower quadrant pain: Secondary | ICD-10-CM | POA: Diagnosis not present

## 2023-07-27 ENCOUNTER — Ambulatory Visit: Admitting: Dermatology

## 2023-07-28 DIAGNOSIS — R911 Solitary pulmonary nodule: Secondary | ICD-10-CM | POA: Diagnosis not present

## 2023-07-28 DIAGNOSIS — N200 Calculus of kidney: Secondary | ICD-10-CM | POA: Diagnosis not present

## 2023-07-28 DIAGNOSIS — Q631 Lobulated, fused and horseshoe kidney: Secondary | ICD-10-CM | POA: Diagnosis not present

## 2023-07-29 DIAGNOSIS — Z01818 Encounter for other preprocedural examination: Secondary | ICD-10-CM | POA: Diagnosis not present

## 2023-07-29 DIAGNOSIS — N201 Calculus of ureter: Secondary | ICD-10-CM | POA: Diagnosis not present

## 2023-07-30 DIAGNOSIS — E876 Hypokalemia: Secondary | ICD-10-CM | POA: Diagnosis not present

## 2023-07-30 DIAGNOSIS — Z87442 Personal history of urinary calculi: Secondary | ICD-10-CM | POA: Diagnosis not present

## 2023-07-30 DIAGNOSIS — E860 Dehydration: Secondary | ICD-10-CM | POA: Diagnosis not present

## 2023-07-30 DIAGNOSIS — Z79899 Other long term (current) drug therapy: Secondary | ICD-10-CM | POA: Diagnosis not present

## 2023-07-30 DIAGNOSIS — K219 Gastro-esophageal reflux disease without esophagitis: Secondary | ICD-10-CM | POA: Diagnosis not present

## 2023-07-30 DIAGNOSIS — N209 Urinary calculus, unspecified: Secondary | ICD-10-CM | POA: Diagnosis not present

## 2023-07-30 DIAGNOSIS — N200 Calculus of kidney: Secondary | ICD-10-CM | POA: Diagnosis not present

## 2023-07-30 DIAGNOSIS — N132 Hydronephrosis with renal and ureteral calculous obstruction: Secondary | ICD-10-CM | POA: Diagnosis not present

## 2023-07-30 DIAGNOSIS — R911 Solitary pulmonary nodule: Secondary | ICD-10-CM | POA: Diagnosis not present

## 2023-08-08 DIAGNOSIS — N200 Calculus of kidney: Secondary | ICD-10-CM | POA: Diagnosis not present

## 2023-08-09 DIAGNOSIS — N200 Calculus of kidney: Secondary | ICD-10-CM | POA: Diagnosis not present

## 2023-08-09 DIAGNOSIS — N401 Enlarged prostate with lower urinary tract symptoms: Secondary | ICD-10-CM | POA: Diagnosis not present

## 2023-08-09 DIAGNOSIS — R1032 Left lower quadrant pain: Secondary | ICD-10-CM | POA: Diagnosis not present

## 2023-08-09 DIAGNOSIS — N201 Calculus of ureter: Secondary | ICD-10-CM | POA: Diagnosis not present

## 2023-08-17 DIAGNOSIS — R911 Solitary pulmonary nodule: Secondary | ICD-10-CM | POA: Diagnosis not present

## 2023-09-19 DIAGNOSIS — H02834 Dermatochalasis of left upper eyelid: Secondary | ICD-10-CM | POA: Diagnosis not present

## 2023-09-19 DIAGNOSIS — H538 Other visual disturbances: Secondary | ICD-10-CM | POA: Diagnosis not present

## 2023-09-19 DIAGNOSIS — H02412 Mechanical ptosis of left eyelid: Secondary | ICD-10-CM | POA: Diagnosis not present

## 2023-09-19 DIAGNOSIS — H02411 Mechanical ptosis of right eyelid: Secondary | ICD-10-CM | POA: Diagnosis not present

## 2023-09-19 DIAGNOSIS — H53483 Generalized contraction of visual field, bilateral: Secondary | ICD-10-CM | POA: Diagnosis not present

## 2023-09-19 DIAGNOSIS — H57813 Brow ptosis, bilateral: Secondary | ICD-10-CM | POA: Diagnosis not present

## 2023-09-19 DIAGNOSIS — H02413 Mechanical ptosis of bilateral eyelids: Secondary | ICD-10-CM | POA: Diagnosis not present

## 2023-09-19 DIAGNOSIS — H02831 Dermatochalasis of right upper eyelid: Secondary | ICD-10-CM | POA: Diagnosis not present

## 2023-09-19 DIAGNOSIS — H0279 Other degenerative disorders of eyelid and periocular area: Secondary | ICD-10-CM | POA: Diagnosis not present

## 2023-09-19 DIAGNOSIS — L987 Excessive and redundant skin and subcutaneous tissue: Secondary | ICD-10-CM | POA: Diagnosis not present

## 2023-09-26 ENCOUNTER — Ambulatory Visit: Admitting: Dermatology

## 2023-09-26 ENCOUNTER — Encounter: Payer: Self-pay | Admitting: Dermatology

## 2023-09-26 VITALS — BP 139/95 | HR 73

## 2023-09-26 DIAGNOSIS — W908XXA Exposure to other nonionizing radiation, initial encounter: Secondary | ICD-10-CM

## 2023-09-26 DIAGNOSIS — L814 Other melanin hyperpigmentation: Secondary | ICD-10-CM

## 2023-09-26 DIAGNOSIS — D229 Melanocytic nevi, unspecified: Secondary | ICD-10-CM

## 2023-09-26 DIAGNOSIS — L821 Other seborrheic keratosis: Secondary | ICD-10-CM | POA: Diagnosis not present

## 2023-09-26 DIAGNOSIS — L578 Other skin changes due to chronic exposure to nonionizing radiation: Secondary | ICD-10-CM | POA: Diagnosis not present

## 2023-09-26 DIAGNOSIS — Z1283 Encounter for screening for malignant neoplasm of skin: Secondary | ICD-10-CM | POA: Diagnosis not present

## 2023-09-26 DIAGNOSIS — D1801 Hemangioma of skin and subcutaneous tissue: Secondary | ICD-10-CM | POA: Diagnosis not present

## 2023-09-26 DIAGNOSIS — L570 Actinic keratosis: Secondary | ICD-10-CM

## 2023-09-26 NOTE — Patient Instructions (Addendum)

## 2023-09-26 NOTE — Progress Notes (Signed)
 New Patient Visit   Subjective  Thomas Mccoy is a 73 y.o. male who presents for the following: Skin Cancer Screening and Full Body Skin Exam. No hx or family hx of skin check.  The patient presents for Total-Body Skin Exam (TBSE) for skin cancer screening and mole check. The patient has spots, moles and lesions to be evaluated, some may be new or changing.  The following portions of the chart were reviewed this encounter and updated as appropriate: medications, allergies, medical history  Review of Systems:  No other skin or systemic complaints except as noted in HPI or Assessment and Plan.  Objective  Well appearing patient in no apparent distress; mood and affect are within normal limits.  A full examination was performed including scalp, head, eyes, ears, nose, lips, neck, chest, axillae, abdomen, back, buttocks, bilateral upper extremities, bilateral lower extremities, hands, feet, fingers, toes, fingernails, and toenails. All findings within normal limits unless otherwise noted below.   Relevant physical exam findings are noted in the Assessment and Plan.    Assessment & Plan   SKIN CANCER SCREENING PERFORMED TODAY.  ACTINIC DAMAGE - Chronic condition, secondary to cumulative UV/sun exposure - diffuse scaly erythematous macules with underlying dyspigmentation - Recommend daily broad spectrum sunscreen SPF 30+ to sun-exposed areas, reapply every 2 hours as needed.  - Staying in the shade or wearing long sleeves, sun glasses (UVA+UVB protection) and wide brim hats (4-inch brim around the entire circumference of the hat) are also recommended for sun protection.  - Call for new or changing lesions.  LENTIGINES, SEBORRHEIC KERATOSES, HEMANGIOMAS - Benign normal skin lesions - Benign-appearing - Call for any changes  MELANOCYTIC NEVI - Tan-brown and/or pink-flesh-colored symmetric macules and papules - Benign appearing on exam today - Observation - Call clinic for new or  changing moles - Recommend daily use of broad spectrum spf 30+ sunscreen to sun-exposed areas.   ACTINIC KERATOSIS Exam: Erythematous thin papules/macules with gritty scale  Actinic keratoses are precancerous spots that appear secondary to cumulative UV radiation exposure/sun exposure over time. They are chronic with expected duration over 1 year. A portion of actinic keratoses will progress to squamous cell carcinoma of the skin. It is not possible to reliably predict which spots will progress to skin cancer and so treatment is recommended to prevent development of skin cancer.  Recommend daily broad spectrum sunscreen SPF 30+ to sun-exposed areas, reapply every 2 hours as needed.  Recommend staying in the shade or wearing long sleeves, sun glasses (UVA+UVB protection) and wide brim hats (4-inch brim around the entire circumference of the hat). Call for new or changing lesions.  Treatment Plan:  Prior to procedure, discussed risks of blister formation, small wound, skin dyspigmentation, or rare scar following cryotherapy. Recommend Vaseline ointment to treated areas while healing.  Destruction Procedure Note Destruction method: cryotherapy   Informed consent: discussed and consent obtained   Lesion destroyed using liquid nitrogen: Yes   Outcome: patient tolerated procedure well with no complications   Post-procedure details: wound care instructions given    AK (ACTINIC KERATOSIS) (2) Left Shoulder - Posterior, Right Forearm - Posterior Destruction of lesion - Left Shoulder - Posterior, Right Forearm - Posterior Complexity: extensive   Destruction method: cryotherapy   Informed consent: discussed and consent obtained   Timeout:  patient name, date of birth, surgical site, and procedure verified Lesion destroyed using liquid nitrogen: Yes   Region frozen until ice ball extended beyond lesion: Yes   Cryotherapy cycles:  2 Outcome: patient tolerated procedure well with no complications    Post-procedure details: wound care instructions given    Return in about 1 year (around 09/25/2024).  I, Haig Levan, Surg Tech III, am acting as scribe for Deneise Finlay, MD.   Documentation: I have reviewed the above documentation for accuracy and completeness, and I agree with the above.  Deneise Finlay, MD

## 2023-10-04 DIAGNOSIS — N201 Calculus of ureter: Secondary | ICD-10-CM | POA: Diagnosis not present

## 2023-10-04 DIAGNOSIS — N401 Enlarged prostate with lower urinary tract symptoms: Secondary | ICD-10-CM | POA: Diagnosis not present

## 2023-10-04 DIAGNOSIS — R1032 Left lower quadrant pain: Secondary | ICD-10-CM | POA: Diagnosis not present

## 2024-01-04 DIAGNOSIS — H5203 Hypermetropia, bilateral: Secondary | ICD-10-CM | POA: Diagnosis not present

## 2024-01-04 DIAGNOSIS — H31001 Unspecified chorioretinal scars, right eye: Secondary | ICD-10-CM | POA: Diagnosis not present

## 2024-01-04 DIAGNOSIS — H52223 Regular astigmatism, bilateral: Secondary | ICD-10-CM | POA: Diagnosis not present

## 2024-01-04 DIAGNOSIS — H2513 Age-related nuclear cataract, bilateral: Secondary | ICD-10-CM | POA: Diagnosis not present

## 2024-02-23 DIAGNOSIS — E785 Hyperlipidemia, unspecified: Secondary | ICD-10-CM | POA: Diagnosis not present

## 2024-02-23 DIAGNOSIS — Z131 Encounter for screening for diabetes mellitus: Secondary | ICD-10-CM | POA: Diagnosis not present

## 2024-02-23 DIAGNOSIS — Z125 Encounter for screening for malignant neoplasm of prostate: Secondary | ICD-10-CM | POA: Diagnosis not present

## 2024-03-01 DIAGNOSIS — E785 Hyperlipidemia, unspecified: Secondary | ICD-10-CM | POA: Diagnosis not present

## 2024-03-01 DIAGNOSIS — Z0000 Encounter for general adult medical examination without abnormal findings: Secondary | ICD-10-CM | POA: Diagnosis not present

## 2024-03-01 DIAGNOSIS — Z23 Encounter for immunization: Secondary | ICD-10-CM | POA: Diagnosis not present

## 2024-03-01 DIAGNOSIS — Z1331 Encounter for screening for depression: Secondary | ICD-10-CM | POA: Diagnosis not present

## 2024-03-01 DIAGNOSIS — N200 Calculus of kidney: Secondary | ICD-10-CM | POA: Diagnosis not present

## 2024-03-01 DIAGNOSIS — R1310 Dysphagia, unspecified: Secondary | ICD-10-CM | POA: Diagnosis not present

## 2024-03-01 DIAGNOSIS — N401 Enlarged prostate with lower urinary tract symptoms: Secondary | ICD-10-CM | POA: Diagnosis not present

## 2024-03-01 DIAGNOSIS — Z1339 Encounter for screening examination for other mental health and behavioral disorders: Secondary | ICD-10-CM | POA: Diagnosis not present

## 2024-03-01 DIAGNOSIS — N138 Other obstructive and reflux uropathy: Secondary | ICD-10-CM | POA: Diagnosis not present

## 2024-03-01 DIAGNOSIS — R911 Solitary pulmonary nodule: Secondary | ICD-10-CM | POA: Diagnosis not present

## 2024-03-01 DIAGNOSIS — R972 Elevated prostate specific antigen [PSA]: Secondary | ICD-10-CM | POA: Diagnosis not present

## 2024-03-01 DIAGNOSIS — Z9181 History of falling: Secondary | ICD-10-CM | POA: Diagnosis not present

## 2024-03-06 DIAGNOSIS — H25013 Cortical age-related cataract, bilateral: Secondary | ICD-10-CM | POA: Diagnosis not present

## 2024-03-06 DIAGNOSIS — H2513 Age-related nuclear cataract, bilateral: Secondary | ICD-10-CM | POA: Diagnosis not present

## 2024-03-06 DIAGNOSIS — H25043 Posterior subcapsular polar age-related cataract, bilateral: Secondary | ICD-10-CM | POA: Diagnosis not present

## 2024-03-06 DIAGNOSIS — H2512 Age-related nuclear cataract, left eye: Secondary | ICD-10-CM | POA: Diagnosis not present

## 2024-03-06 DIAGNOSIS — H18413 Arcus senilis, bilateral: Secondary | ICD-10-CM | POA: Diagnosis not present

## 2024-04-09 ENCOUNTER — Other Ambulatory Visit: Payer: Self-pay | Admitting: Urology

## 2024-04-09 DIAGNOSIS — R972 Elevated prostate specific antigen [PSA]: Secondary | ICD-10-CM

## 2024-05-15 ENCOUNTER — Other Ambulatory Visit

## 2024-06-08 ENCOUNTER — Other Ambulatory Visit

## 2024-09-25 ENCOUNTER — Ambulatory Visit: Admitting: Dermatology
